# Patient Record
Sex: Female | Born: 1950
Health system: Southern US, Community
[De-identification: ages and names within clinical notes are randomized; demographics above are authoritative.]

## PROBLEM LIST (undated history)

## (undated) DIAGNOSIS — E739 Lactose intolerance, unspecified: Secondary | ICD-10-CM

## (undated) DIAGNOSIS — J387 Other diseases of larynx: Secondary | ICD-10-CM

## (undated) DIAGNOSIS — M7661 Achilles tendinitis, right leg: Secondary | ICD-10-CM

## (undated) DIAGNOSIS — R7303 Prediabetes: Secondary | ICD-10-CM

## (undated) DIAGNOSIS — Z889 Allergy status to unspecified drugs, medicaments and biological substances status: Secondary | ICD-10-CM

## (undated) DIAGNOSIS — M9260 Juvenile osteochondrosis of tarsus, unspecified ankle: Secondary | ICD-10-CM

## (undated) DIAGNOSIS — Z8781 Personal history of (healed) traumatic fracture: Secondary | ICD-10-CM

## (undated) DIAGNOSIS — R7301 Impaired fasting glucose: Secondary | ICD-10-CM

## (undated) DIAGNOSIS — M8580 Other specified disorders of bone density and structure, unspecified site: Secondary | ICD-10-CM

## (undated) DIAGNOSIS — A6009 Herpesviral infection of other urogenital tract: Secondary | ICD-10-CM

## (undated) HISTORY — DX: Other diseases of larynx: J38.7

## (undated) HISTORY — DX: Other specified disorders of bone density and structure, unspecified site: M85.80

## (undated) HISTORY — DX: Lactose intolerance, unspecified: E73.9

## (undated) HISTORY — DX: Juvenile osteochondrosis of tarsus, unspecified ankle: M92.60

## (undated) HISTORY — DX: Achilles tendinitis, right leg: M76.61

## (undated) HISTORY — DX: Herpesviral infection of other urogenital tract: A60.09

## (undated) HISTORY — PX: COLONOSCOPY: SHX174

## (undated) HISTORY — PX: COSMETIC SURGERY: SHX468

## (undated) HISTORY — DX: Personal history of (healed) traumatic fracture: Z87.81

## (undated) HISTORY — DX: Allergy status to unspecified drugs, medicaments and biological substances status: Z88.9

## (undated) HISTORY — PX: BREAST CYST ASPIRATION: SHX578

## (undated) HISTORY — DX: Impaired fasting glucose: R73.01

---
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
## 1998-11-28 ENCOUNTER — Ambulatory Visit (HOSPITAL_COMMUNITY): Admission: RE | Admit: 1998-11-28 | Discharge: 1998-11-28 | Payer: Self-pay | Admitting: Family Medicine

## 2000-10-14 ENCOUNTER — Other Ambulatory Visit: Admission: RE | Admit: 2000-10-14 | Discharge: 2000-10-14 | Payer: Self-pay | Admitting: Obstetrics and Gynecology

## 2000-10-16 ENCOUNTER — Emergency Department (HOSPITAL_COMMUNITY): Admission: EM | Admit: 2000-10-16 | Discharge: 2000-10-17 | Payer: Self-pay | Admitting: Emergency Medicine

## 2000-10-17 ENCOUNTER — Encounter: Payer: Self-pay | Admitting: Emergency Medicine

## 2001-11-04 ENCOUNTER — Other Ambulatory Visit: Admission: RE | Admit: 2001-11-04 | Discharge: 2001-11-04 | Payer: Self-pay | Admitting: Obstetrics and Gynecology

## 2002-01-31 ENCOUNTER — Emergency Department (HOSPITAL_COMMUNITY): Admission: EM | Admit: 2002-01-31 | Discharge: 2002-01-31 | Payer: Self-pay | Admitting: Emergency Medicine

## 2002-01-31 ENCOUNTER — Encounter: Payer: Self-pay | Admitting: Emergency Medicine

## 2002-11-07 ENCOUNTER — Other Ambulatory Visit: Admission: RE | Admit: 2002-11-07 | Discharge: 2002-11-07 | Payer: Self-pay | Admitting: Obstetrics and Gynecology

## 2002-12-19 ENCOUNTER — Ambulatory Visit (HOSPITAL_COMMUNITY): Admission: RE | Admit: 2002-12-19 | Discharge: 2002-12-19 | Payer: Self-pay | Admitting: Gastroenterology

## 2003-09-19 ENCOUNTER — Other Ambulatory Visit: Admission: RE | Admit: 2003-09-19 | Discharge: 2003-09-19 | Payer: Self-pay | Admitting: Obstetrics and Gynecology

## 2004-09-19 ENCOUNTER — Other Ambulatory Visit: Admission: RE | Admit: 2004-09-19 | Discharge: 2004-09-19 | Payer: Self-pay | Admitting: Obstetrics and Gynecology

## 2007-02-18 LAB — HM COLONOSCOPY: HM Colonoscopy: NORMAL

## 2009-02-17 DIAGNOSIS — A6009 Herpesviral infection of other urogenital tract: Secondary | ICD-10-CM | POA: Insufficient documentation

## 2009-02-17 HISTORY — DX: Herpesviral infection of other urogenital tract: A60.09

## 2010-08-02 ENCOUNTER — Ambulatory Visit (INDEPENDENT_AMBULATORY_CARE_PROVIDER_SITE_OTHER): Payer: Commercial Indemnity | Admitting: Family Medicine

## 2010-08-02 ENCOUNTER — Encounter: Payer: Self-pay | Admitting: Family Medicine

## 2010-08-02 DIAGNOSIS — Z9109 Other allergy status, other than to drugs and biological substances: Secondary | ICD-10-CM

## 2010-08-02 DIAGNOSIS — Z0000 Encounter for general adult medical examination without abnormal findings: Secondary | ICD-10-CM

## 2010-08-02 DIAGNOSIS — A6009 Herpesviral infection of other urogenital tract: Secondary | ICD-10-CM

## 2010-08-02 DIAGNOSIS — Z860100 Personal history of colon polyps, unspecified: Secondary | ICD-10-CM

## 2010-08-02 DIAGNOSIS — Z889 Allergy status to unspecified drugs, medicaments and biological substances status: Secondary | ICD-10-CM | POA: Insufficient documentation

## 2010-08-02 DIAGNOSIS — B059 Measles without complication: Secondary | ICD-10-CM

## 2010-08-02 DIAGNOSIS — A6000 Herpesviral infection of urogenital system, unspecified: Secondary | ICD-10-CM

## 2010-08-02 DIAGNOSIS — E739 Lactose intolerance, unspecified: Secondary | ICD-10-CM | POA: Insufficient documentation

## 2010-08-02 DIAGNOSIS — E860 Dehydration: Secondary | ICD-10-CM | POA: Insufficient documentation

## 2010-08-02 DIAGNOSIS — N6019 Diffuse cystic mastopathy of unspecified breast: Secondary | ICD-10-CM | POA: Insufficient documentation

## 2010-08-02 DIAGNOSIS — L989 Disorder of the skin and subcutaneous tissue, unspecified: Secondary | ICD-10-CM

## 2010-08-02 DIAGNOSIS — B019 Varicella without complication: Secondary | ICD-10-CM

## 2010-08-02 DIAGNOSIS — D709 Neutropenia, unspecified: Secondary | ICD-10-CM

## 2010-08-02 HISTORY — DX: Allergy status to unspecified drugs, medicaments and biological substances: Z88.9

## 2010-08-02 NOTE — Patient Instructions (Signed)

## 2010-08-02 NOTE — Assessment & Plan Note (Signed)
Patient admits to historically bad fluid intake and she agrees to try and increase her intake to 64 oz or more daily. She mentions some very infrequent episodes of feeling light headed for less than a second in the past and resolve spontaneously. She does believe they occur most when she is jogging in the early morning before eating or drinking anything. She encouraged to hydrate better and to consider an electrolyte beverage for exercise. Notify us if episodes become more frequent or last longer. She also notes some vasovagal syncope when she gives blood but is generally able to constrol it by not looking. Otherwise no h/o syncope

## 2010-08-02 NOTE — Assessment & Plan Note (Signed)
No recent difficulties, patient has given up all caffeine

## 2010-08-02 NOTE — Assessment & Plan Note (Signed)
She follows with OB/GYN Dr Dareen Piano. She will manage this there, we discussed bone densitometry at length today. She has had a baseline scan around 52 which was normal she will decide with her GYN whether to do another one now or wait til 62, she is encouraged to continue trying to get 3 servings of calcium/vit D daily. Continue exercise.

## 2010-08-02 NOTE — Assessment & Plan Note (Signed)
Patient reports she has not had fasting labs in roughly 3 years, she agrees to come back next week fasting for labs. She had a clean colonoscopy at 63 with Dr Loreta Ave needs another one at 52 if nothing changes. Will continue with MGM and paps with OB/GYN

## 2010-08-02 NOTE — Progress Notes (Signed)
Bonnie [name] [id number] [birth year] 08/02/2010      Progress Note New Patient  Subjective  Chief Complaint  Chief Complaint  Patient presents with  . Establish Care    new patient    HPI  Patient is a 60 year old Caucasian female who is in today for new patient appt. She is generally in good health but has not had a check up with a PCP including lab work in roughly 3 years. She does see a OB/GYN who does annual paps and keeps her MGMs current. She has had trouble with fibrocystic changes in the past but with giving up caffeine that has resolved. No recent febrile illness/chills/congestion/CP/palp/SOB/GI or GU c/o. She is noting some stiffness in her right arm but she attributes that to swimming this morning for the first time in a long time. She was having trouble with some gaseousness but she cut out Lactose a couple of months ago and that has improved. No diarrhea/bloody or tarry stool. Had her first colonosocopy at 62, had a benign polyp, had a repeat at 89 which was clear and she is now on a 10 year cycle with Dr Loreta Ave  Past Medical History  Diagnosis Date  . Chicken pox child  . Measles as a child  . Genital herpes in women 02-17-09  . Lactose intolerance     ?  . Multiple allergies 08/02/2010  . Neutropenia 08/02/2010    Past Surgical History  Procedure Date  . Cataract surgery 4-12 and 5-12  . Broken leg 6 months old  . Teeth pulled 60 yrs old  . Cosmetic surgery 60 yrs old    nose    Family History  Problem Relation Age of Onset  . Heart disease Father     passed after by pass surgery  . Hypertension Father   . Hyperlipidemia Father   . Other Sister     auto immune issues  . Gout Sister   . Autoimmune disease Sister     fatigue, rashes  . Osteopenia Sister   . Osteoporosis Mother     History   Social History  . Marital Status: Married    Spouse Name: N/A    Number of Children: N/A  . Years of Education: N/A   Occupational History  . Not on file.    Social History Main Topics  . Smoking status: Never Smoker   . Smokeless tobacco: Never Used  . Alcohol Use: No     1-2 beers a week or 1-2 glasses of wine a week  . Drug Use: No  . Sexually Active: Yes -- Female partner(s)   Other Topics Concern  . Not on file   Social History Narrative  . No narrative on file     No Known Allergies  Review of Systems  Review of Systems  Constitutional: Negative for fever, chills and malaise/fatigue.  HENT: Negative for hearing loss, nosebleeds and congestion.   Eyes: Negative for discharge.  Respiratory: Negative for cough, sputum production, shortness of breath and wheezing.   Cardiovascular: Negative for chest pain, palpitations and leg swelling.  Gastrointestinal: Negative for heartburn, nausea, vomiting, abdominal pain, diarrhea, constipation and blood in stool.  Genitourinary: Negative for dysuria, urgency, frequency and hematuria.  Musculoskeletal: Negative for myalgias, back pain and falls.  Skin: Negative for rash.  Neurological: Negative for dizziness, tremors, sensory change, focal weakness, loss of consciousness, weakness and headaches.  Endo/Heme/Allergies: Negative for polydipsia. Does not bruise/bleed easily.  Psychiatric/Behavioral: Negative for depression and  suicidal ideas. The patient is not nervous/anxious and does not have insomnia.     Objective  BP 114/75  Pulse 67  Temp(Src) 97.6 F (36.4 C) (Oral)  Ht 5' 4.25" (1.632 m)  Wt 121 lb 12.8 oz (55.248 kg)  BMI 20.74 kg/m2  SpO2 100%  Physical Exam  Physical Exam  Constitutional: She is oriented to person, place, and time and well-developed, well-nourished, and in no distress. No distress.  HENT:  Head: Normocephalic and atraumatic.  Right Ear: External ear normal.  Left Ear: External ear normal.  Nose: Nose normal.  Mouth/Throat: Oropharynx is clear and moist. No oropharyngeal exudate.  Eyes: Conjunctivae are normal. Pupils are equal, round, and reactive  to light. Right eye exhibits no discharge. Left eye exhibits no discharge. No scleral icterus.  Neck: Normal range of motion. Neck supple. No thyromegaly present.  Cardiovascular: Normal rate, regular rhythm, normal heart sounds and intact distal pulses.   No murmur heard. Pulmonary/Chest: Effort normal and breath sounds normal. No respiratory distress. She has no wheezes. She has no rales.  Abdominal: Soft. Bowel sounds are normal. She exhibits no distension and no mass. There is no tenderness.  Musculoskeletal: Normal range of motion. She exhibits no edema and no tenderness.  Lymphadenopathy:    She has no cervical adenopathy.  Neurological: She is alert and oriented to person, place, and time. She has normal reflexes. No cranial nerve deficit. Coordination normal.  Skin: Skin is warm and dry. No rash noted. She is not diaphoretic.  Psychiatric: Mood, memory and affect normal.       Assessment & Plan  Neutropenia Very slightly low when last checked will repeat CBC with blood draw  Dehydration Patient admits to historically bad fluid intake and she agrees to try and increase her intake to 64 oz or more daily. She mentions some very infrequent episodes of feeling light headed for less than a second in the past and resolve spontaneously. She does believe they occur most when she is jogging in the early morning before eating or drinking anything. She encouraged to hydrate better and to consider an electrolyte beverage for exercise. Notify us if episodes become more frequent or last longer. She also notes some vasovagal syncope when she gives blood but is generally able to constrol it by not looking. Otherwise no h/o syncope  Genital herpes in women She follows with OB/GYN Dr Dareen Piano. She will manage this there, we discussed bone densitometry at length today. She has had a baseline scan around 52 which was normal she will decide with her GYN whether to do another one now or wait til 62, she  is encouraged to continue trying to get 3 servings of calcium/vit D daily. Continue exercise.  Lactose intolerance Patient reports switching to lactose free products several months ago and seeing a great improvement in her gaseousness. She will continue for now but be aware of need for calcium intake  Multiple allergies No adult symptoms at present  Preventative health care Patient reports she has not had fasting labs in roughly 3 years, she agrees to come back next week fasting for labs. She had a clean colonoscopy at 76 with Dr Loreta Ave needs another one at 75 if nothing changes. Will continue with MGM and paps with OB/GYN  Fibrocystic breast disease No recent difficulties, patient has given up all caffeine  History of colon polyps Patient had a benign polyp with her first colonoscopy at 60yo, clean at 57 is now on 10 year intervals  with Dr Loreta Ave

## 2010-08-02 NOTE — Assessment & Plan Note (Signed)
Patient had a benign polyp with her first colonoscopy at 60yo, clean at 53 is now on 10 year intervals with Dr Loreta Ave

## 2010-08-02 NOTE — Assessment & Plan Note (Signed)
No adult symptoms at present

## 2010-08-02 NOTE — Assessment & Plan Note (Signed)
Patient reports switching to lactose free products several months ago and seeing a great improvement in her gaseousness. She will continue for now but be aware of need for calcium intake

## 2010-08-02 NOTE — Assessment & Plan Note (Signed)
Very slightly low when last checked will repeat CBC with blood draw

## 2010-08-19 ENCOUNTER — Other Ambulatory Visit (INDEPENDENT_AMBULATORY_CARE_PROVIDER_SITE_OTHER): Payer: Commercial Indemnity

## 2010-08-19 DIAGNOSIS — Z0000 Encounter for general adult medical examination without abnormal findings: Secondary | ICD-10-CM

## 2010-08-19 LAB — CBC WITH DIFFERENTIAL/PLATELET
Basophils Absolute: 0 10*3/uL (ref 0.0–0.1)
Basophils Relative: 1.1 % (ref 0.0–3.0)
Eosinophils Absolute: 0.1 10*3/uL (ref 0.0–0.7)
Eosinophils Relative: 2.4 % (ref 0.0–5.0)
HCT: 35.7 % — ABNORMAL LOW (ref 36.0–46.0)
Hemoglobin: 12.2 g/dL (ref 12.0–15.0)
Lymphocytes Relative: 34.3 % (ref 12.0–46.0)
Lymphs Abs: 1.4 10*3/uL (ref 0.7–4.0)
MCHC: 34.3 g/dL (ref 30.0–36.0)
MCV: 86.8 fl (ref 78.0–100.0)
Monocytes Absolute: 0.3 10*3/uL (ref 0.1–1.0)
Monocytes Relative: 7.4 % (ref 3.0–12.0)
Neutro Abs: 2.2 10*3/uL (ref 1.4–7.7)
Neutrophils Relative %: 54.8 % (ref 43.0–77.0)
Platelets: 167 10*3/uL (ref 150.0–400.0)
RBC: 4.11 Mil/uL (ref 3.87–5.11)
RDW: 12.9 % (ref 11.5–14.6)
WBC: 4 10*3/uL — ABNORMAL LOW (ref 4.5–10.5)

## 2010-08-19 LAB — RENAL FUNCTION PANEL
Albumin: 4.4 g/dL (ref 3.5–5.2)
BUN: 17 mg/dL (ref 6–23)
CO2: 31 mEq/L (ref 19–32)
Calcium: 9.3 mg/dL (ref 8.4–10.5)
Chloride: 105 mEq/L (ref 96–112)
Creatinine, Ser: 0.8 mg/dL (ref 0.4–1.2)
GFR: 83.09 mL/min (ref >60.00)
Glucose, Bld: 163 mg/dL — ABNORMAL HIGH (ref 70–99)
Phosphorus: 3.6 mg/dL (ref 2.3–4.6)
Potassium: 5 mEq/L (ref 3.5–5.1)
Sodium: 139 mEq/L (ref 135–145)

## 2010-08-19 LAB — LIPID PANEL
Cholesterol: 163 mg/dL (ref 0–200)
HDL: 56.7 mg/dL (ref >39.00)
LDL Cholesterol: 97 mg/dL (ref 0–99)
Total CHOL/HDL Ratio: 3
Triglycerides: 47 mg/dL (ref 0.0–149.0)
VLDL: 9.4 mg/dL (ref 0.0–40.0)

## 2010-08-19 LAB — HEPATIC FUNCTION PANEL
ALT: 18 U/L (ref 0–35)
AST: 23 U/L (ref 0–37)
Albumin: 4.4 g/dL (ref 3.5–5.2)
Alkaline Phosphatase: 72 U/L (ref 39–117)
Bilirubin, Direct: 0 mg/dL (ref 0.0–0.3)
Total Bilirubin: 0.7 mg/dL (ref 0.3–1.2)
Total Protein: 6.6 g/dL (ref 6.0–8.3)

## 2010-08-19 LAB — TSH: TSH: 1.49 u[IU]/mL (ref 0.35–5.50)

## 2010-10-18 ENCOUNTER — Telehealth: Payer: Self-pay | Admitting: Family Medicine

## 2010-10-18 NOTE — Telephone Encounter (Signed)
Patient would like if she need to have these test done a renal function panel test ,tsh,hepatic function panel ,cbc with diff ,and lipid profile.

## 2010-10-21 NOTE — Telephone Encounter (Signed)
No did a lipid, tsh, hepatic in July which were all normal. Her CBC was off slightly and her sugar was up so would do a cbc, renal and hgba1c, fasting if possible

## 2010-10-22 NOTE — Telephone Encounter (Signed)
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

## 2010-12-11 ENCOUNTER — Encounter: Payer: Self-pay | Admitting: Family Medicine

## 2010-12-11 ENCOUNTER — Ambulatory Visit (INDEPENDENT_AMBULATORY_CARE_PROVIDER_SITE_OTHER): Payer: Commercial Indemnity | Admitting: Family Medicine

## 2010-12-11 DIAGNOSIS — M542 Cervicalgia: Secondary | ICD-10-CM

## 2010-12-11 NOTE — Assessment & Plan Note (Addendum)
Symptoms present for 8 days now. Describes tightness and pulling worse after working out. She describes the skin and muscle is tender to the touch. Symptoms radiate from the base of the skull to the shoulder along the sternocleidomastoid muscle. Encouraged moist heat and gentle stretching. Aspirin Aspercreme when necessary and return for further evaluation if symptoms persist or worsen.

## 2010-12-11 NOTE — Patient Instructions (Signed)
Back Pain, Adult Low back pain is very common. About 1 in 5 people have back pain.The cause of low back pain is rarely dangerous. The pain often gets better over time.About half of people with a sudden onset of back pain feel better in just 2 weeks. About 8 in 10 people feel better by 6 weeks.  CAUSES Some common causes of back pain include:  Strain of the muscles or ligaments supporting the spine.   Wear and tear (degeneration) of the spinal discs.   Arthritis.   Direct injury to the back.  DIAGNOSIS Most of the time, the direct cause of low back pain is not known.However, back pain can be treated effectively even when the exact cause of the pain is unknown.Answering your caregiver's questions about your overall health and symptoms is one of the most accurate ways to make sure the cause of your pain is not dangerous. If your caregiver needs more information, he or she may order lab work or imaging tests (X-rays or MRIs).However, even if imaging tests show changes in your back, this usually does not require surgery. HOME CARE INSTRUCTIONS For many people, back pain returns.Since low back pain is rarely dangerous, it is often a condition that people can learn to manageon their own.   Remain active. It is stressful on the back to sit or stand in one place. Do not sit, drive, or stand in one place for more than 30 minutes at a time. Take short walks on level surfaces as soon as pain allows.Try to increase the length of time you walk each day.   Do not stay in bed.Resting more than 1 or 2 days can delay your recovery.   Do not avoid exercise or work.Your body is made to move.It is not dangerous to be active, even though your back may hurt.Your back will likely heal faster if you return to being active before your pain is gone.   Pay attention to your body when you bend and lift. Many people have less discomfortwhen lifting if they bend their knees, keep the load close to their  bodies,and avoid twisting. Often, the most comfortable positions are those that put less stress on your recovering back.   Find a comfortable position to sleep. Use a firm mattress and lie on your side with your knees slightly bent. If you lie on your back, put a pillow under your knees.   Only take over-the-counter or prescription medicines as directed by your caregiver. Over-the-counter medicines to reduce pain and inflammation are often the most helpful.Your caregiver may prescribe muscle relaxant drugs.These medicines help dull your pain so you can more quickly return to your normal activities and healthy exercise.   Put ice on the injured area.   Put ice in a plastic bag.   Place a towel between your skin and the bag.   Leave the ice on for 15 to 20 minutes, 3 to 4 times a day for the first 2 to 3 days. After that, ice and heat may be alternated to reduce pain and spasms.   Ask your caregiver about trying back exercises and gentle massage. This may be of some benefit.   Avoid feeling anxious or stressed.Stress increases muscle tension and can worsen back pain.It is important to recognize when you are anxious or stressed and learn ways to manage it.Exercise is a great option.  SEEK MEDICAL CARE IF:  You have pain that is not relieved with rest or medicine.   You have   pain that does not improve in 1 week.   You have new symptoms.   You are generally not feeling well.  SEEK IMMEDIATE MEDICAL CARE IF:   You have pain that radiates from your back into your legs.   You develop new bowel or bladder control problems.   You have unusual weakness or numbness in your arms or legs.   You develop nausea or vomiting.   You develop abdominal pain.   You feel faint.  Document Released: 02/03/2005 Document Revised: 10/16/2010 Document Reviewed: 06/24/2010 [employer] Patient Information 2012 [place], [place].   Consider Aspirin once to twice daily as needed, can apply Aspercreme as  needed, and moist heat and gentle stretching twice daily, call if no improvement or symptoms worsen

## 2010-12-15 NOTE — Progress Notes (Signed)
Bonnie [name] [id number] [birth year] 12/15/2010      Progress Note-Follow Up  Subjective  Chief Complaint  Chief Complaint  Patient presents with  . Otalgia    left ear X 8 days    HPI   patient is a 60 year old Caucasian female generally in good health who has been having trouble w neck and ear on the left side for about 8 days. She notes the symptoms do develop after a workout. She describes the skin and muscle around the base of the skull surrounding her left ear is tender. The pain radiates down the left side of the neck to the left shoulder. The symptoms and left arm. No trauma or falls. No congestion, hearing changes. No cough, no fever, chest pain, gout, shortness of breath, GI or GU complaints noted today.  Past Medical History  Diagnosis Date  . Chicken pox child  . Measles as a child  . Genital herpes in women 02-17-09  . Lactose intolerance     ?  . Multiple allergies 08/02/2010  . Neutropenia 08/02/2010  . Neck pain, acute 12/11/2010    Past Surgical History  Procedure Date  . Cataract surgery 4-12 and 5-12  . Broken leg 8 months old  . Teeth pulled 60 yrs old  . Cosmetic surgery 60 yrs old    nose    Family History  Problem Relation Age of Onset  . Heart disease Father     passed after by pass surgery  . Hypertension Father   . Hyperlipidemia Father   . Other Sister     auto immune issues  . Gout Sister   . Autoimmune disease Sister     fatigue, rashes  . Osteopenia Sister   . Osteoporosis Mother     History   Social History  . Marital Status: Married    Spouse Name: N/A    Number of Children: N/A  . Years of Education: N/A   Occupational History  . Not on file.   Social History Main Topics  . Smoking status: Never Smoker   . Smokeless tobacco: Never Used  . Alcohol Use: No     1-2 beers a week or 1-2 glasses of wine a week  . Drug Use: No  . Sexually Active: Yes -- Female partner(s)   Other Topics Concern  . Not on file   Social History  Narrative  . No narrative on file    Current Outpatient Prescriptions on File Prior to Visit  Medication Sig Dispense Refill  . Calcium Carbonate-Vitamin D (CALCIUM-VITAMIN D) 500-200 MG-UNIT per tablet Take 1 tablet by mouth daily.        . multivitamin (THERAGRAN) tablet Take 1 tablet by mouth daily.          No Known Allergies  Review of Systems  Review of Systems  Constitutional: Negative for fever and malaise/fatigue.  HENT: Positive for neck pain. Negative for congestion.   Eyes: Negative for discharge.  Respiratory: Negative for shortness of breath.   Cardiovascular: Negative for chest pain, palpitations and leg swelling.  Gastrointestinal: Negative for nausea, abdominal pain and diarrhea.  Genitourinary: Negative for dysuria.  Musculoskeletal: Negative for falls.  Skin: Negative for rash.  Neurological: Negative for loss of consciousness and headaches.  Endo/Heme/Allergies: Negative for polydipsia.  Psychiatric/Behavioral: Negative for depression and suicidal ideas. The patient is not nervous/anxious and does not have insomnia.     Objective  BP 111/73  Pulse 64  Temp(Src) 97.8 F (36.6 C) (Oral)  Ht 5' 4.25" (1.632 m)  Wt 119 lb 6.4 oz (54.159 kg)  BMI 20.34 kg/m2  SpO2 99%  Physical Exam  Physical Exam  Constitutional: She is oriented to person, place, and time and well-developed, well-nourished, and in no distress. No distress.  HENT:  Head: Normocephalic and atraumatic.  Eyes: Conjunctivae are normal.  Neck: Normal range of motion. Neck supple. No thyromegaly present.  Cardiovascular: Normal rate, regular rhythm and normal heart sounds.   No murmur heard. Pulmonary/Chest: Effort normal and breath sounds normal. She has no wheezes.  Abdominal: She exhibits no distension and no mass.  Musculoskeletal: Normal range of motion. She exhibits tenderness. She exhibits no edema.       Along left sternocleidomastoid muscle  Lymphadenopathy:    She has no  cervical adenopathy.  Neurological: She is alert and oriented to person, place, and time.  Skin: Skin is warm and dry. No rash noted. She is not diaphoretic.  Psychiatric: Memory, affect and judgment normal.    Lab Results  Component Value Date   TSH 1.49 08/19/2010   Lab Results  Component Value Date   WBC 4.0* 08/19/2010   HGB 12.2 08/19/2010   HCT 35.7* 08/19/2010   MCV 86.8 08/19/2010   PLT 167.0 08/19/2010   Lab Results  Component Value Date   CREATININE 0.8 08/19/2010   BUN 17 08/19/2010   NA 139 08/19/2010   K 5.0 08/19/2010   CL 105 08/19/2010   CO2 31 08/19/2010   Lab Results  Component Value Date   ALT 18 08/19/2010   AST 23 08/19/2010   ALKPHOS 72 08/19/2010   BILITOT 0.7 08/19/2010   Lab Results  Component Value Date   CHOL 163 08/19/2010   Lab Results  Component Value Date   HDL 56.70 08/19/2010   Lab Results  Component Value Date   LDLCALC 97 08/19/2010   Lab Results  Component Value Date   TRIG 47.0 08/19/2010   Lab Results  Component Value Date   CHOLHDL 3 08/19/2010     Assessment & Plan  Neck pain, acute Symptoms present for 8 days now. Describes tightness and pulling worse after working out. She describes the skin and muscle is tender to the touch. Symptoms radiate from the base of the skull to the shoulder along the sternocleidomastoid muscle. Encouraged moist heat and gentle stretching. Aspirin Aspercreme when necessary and return for further evaluation if symptoms persist or worsen.

## 2011-08-26 ENCOUNTER — Encounter: Payer: Self-pay | Admitting: Family Medicine

## 2011-08-26 ENCOUNTER — Ambulatory Visit (INDEPENDENT_AMBULATORY_CARE_PROVIDER_SITE_OTHER): Payer: Commercial Indemnity | Admitting: Family Medicine

## 2011-08-26 VITALS — BP 112/80 | HR 68 | Temp 98.0°F | Resp 18 | Wt 122.1 lb

## 2011-08-26 DIAGNOSIS — A6009 Herpesviral infection of other urogenital tract: Secondary | ICD-10-CM

## 2011-08-26 DIAGNOSIS — M25569 Pain in unspecified knee: Secondary | ICD-10-CM

## 2011-08-26 DIAGNOSIS — R7309 Other abnormal glucose: Secondary | ICD-10-CM

## 2011-08-26 DIAGNOSIS — R358 Other polyuria: Secondary | ICD-10-CM

## 2011-08-26 DIAGNOSIS — R739 Hyperglycemia, unspecified: Secondary | ICD-10-CM

## 2011-08-26 DIAGNOSIS — M25561 Pain in right knee: Secondary | ICD-10-CM | POA: Insufficient documentation

## 2011-08-26 DIAGNOSIS — H6092 Unspecified otitis externa, left ear: Secondary | ICD-10-CM | POA: Insufficient documentation

## 2011-08-26 DIAGNOSIS — A6000 Herpesviral infection of urogenital system, unspecified: Secondary | ICD-10-CM

## 2011-08-26 DIAGNOSIS — R3589 Other polyuria: Secondary | ICD-10-CM | POA: Insufficient documentation

## 2011-08-26 DIAGNOSIS — T7840XA Allergy, unspecified, initial encounter: Secondary | ICD-10-CM

## 2011-08-26 DIAGNOSIS — Z889 Allergy status to unspecified drugs, medicaments and biological substances status: Secondary | ICD-10-CM

## 2011-08-26 DIAGNOSIS — Z0000 Encounter for general adult medical examination without abnormal findings: Secondary | ICD-10-CM

## 2011-08-26 DIAGNOSIS — H60399 Other infective otitis externa, unspecified ear: Secondary | ICD-10-CM

## 2011-08-26 DIAGNOSIS — M542 Cervicalgia: Secondary | ICD-10-CM

## 2011-08-26 DIAGNOSIS — Z86010 Personal history of colonic polyps: Secondary | ICD-10-CM

## 2011-08-26 LAB — HEPATIC FUNCTION PANEL
ALT: 19 U/L (ref 0–35)
AST: 21 U/L (ref 0–37)
Albumin: 4.4 g/dL (ref 3.5–5.2)
Alkaline Phosphatase: 63 U/L (ref 39–117)
Bilirubin, Direct: 0 mg/dL (ref 0.0–0.3)
Total Bilirubin: 0.7 mg/dL (ref 0.3–1.2)
Total Protein: 7.3 g/dL (ref 6.0–8.3)

## 2011-08-26 LAB — RENAL FUNCTION PANEL
Albumin: 4.4 g/dL (ref 3.5–5.2)
BUN: 20 mg/dL (ref 6–23)
CO2: 29 mEq/L (ref 19–32)
Calcium: 9.7 mg/dL (ref 8.4–10.5)
Chloride: 101 mEq/L (ref 96–112)
Creatinine, Ser: 0.8 mg/dL (ref 0.4–1.2)
GFR: 79.18 mL/min (ref >60.00)
Glucose, Bld: 83 mg/dL (ref 70–99)
Phosphorus: 3.5 mg/dL (ref 2.3–4.6)
Potassium: 4.5 mEq/L (ref 3.5–5.1)
Sodium: 138 mEq/L (ref 135–145)

## 2011-08-26 LAB — CBC WITH DIFFERENTIAL/PLATELET
Basophils Absolute: 0 10*3/uL (ref 0.0–0.1)
Basophils Relative: 1 % (ref 0.0–3.0)
Eosinophils Absolute: 0.1 10*3/uL (ref 0.0–0.7)
Eosinophils Relative: 2 % (ref 0.0–5.0)
HCT: 37.1 % (ref 36.0–46.0)
Hemoglobin: 12.5 g/dL (ref 12.0–15.0)
Lymphocytes Relative: 34.6 % (ref 12.0–46.0)
Lymphs Abs: 1.4 10*3/uL (ref 0.7–4.0)
MCHC: 33.6 g/dL (ref 30.0–36.0)
MCV: 86.4 fl (ref 78.0–100.0)
Monocytes Absolute: 0.4 10*3/uL (ref 0.1–1.0)
Monocytes Relative: 9.4 % (ref 3.0–12.0)
Neutro Abs: 2.1 10*3/uL (ref 1.4–7.7)
Neutrophils Relative %: 53 % (ref 43.0–77.0)
Platelets: 172 10*3/uL (ref 150.0–400.0)
RBC: 4.29 Mil/uL (ref 3.87–5.11)
RDW: 12.8 % (ref 11.5–14.6)
WBC: 3.9 10*3/uL — ABNORMAL LOW (ref 4.5–10.5)

## 2011-08-26 LAB — HEMOGLOBIN A1C: Hgb A1c MFr Bld: 6 % (ref 4.6–6.5)

## 2011-08-26 LAB — POCT URINALYSIS DIPSTICK
Bilirubin, UA: NEGATIVE
Blood, UA: NEGATIVE
Glucose, UA: NEGATIVE
Ketones, UA: NEGATIVE
Leukocytes, UA: NEGATIVE
Nitrite, UA: NEGATIVE
Protein, UA: NEGATIVE
Spec Grav, UA: 1.02
Urobilinogen, UA: 0.2
pH, UA: 7

## 2011-08-26 LAB — LIPID PANEL
Cholesterol: 189 mg/dL (ref 0–200)
HDL: 65 mg/dL (ref >39.00)
LDL Cholesterol: 109 mg/dL — ABNORMAL HIGH (ref 0–99)
Total CHOL/HDL Ratio: 3
Triglycerides: 74 mg/dL (ref 0.0–149.0)
VLDL: 14.8 mg/dL (ref 0.0–40.0)

## 2011-08-26 MED ORDER — HYDROCORTISONE-ACETIC ACID 1-2 % OT SOLN: 4.0000 [drp] | Freq: Two times a day (BID) | OTIC | Status: AC

## 2011-08-26 NOTE — Assessment & Plan Note (Signed)
No significant symptoms noted today

## 2011-08-26 NOTE — Assessment & Plan Note (Signed)
No c/o today 

## 2011-08-26 NOTE — Assessment & Plan Note (Signed)
Over lateral meniscus with excessive use and twisting, encouraged soft brace especially when hiking, ice and Aspercreme, start a MegaRed krill oil cap daily and report if no improvement

## 2011-08-26 NOTE — Assessment & Plan Note (Signed)
Needs next colonoscopy in 2019, has a gynecologist she sees regularly. Check fasting labs and encouraged a heart healthy diet.

## 2011-08-26 NOTE — Patient Instructions (Signed)
Preventive Care for Adults, Female A healthy lifestyle and preventive care can promote health and wellness. Preventive health guidelines for women include the following key practices.  A routine yearly physical is a good way to check with your caregiver about your health and preventive screening. It is a chance to share any concerns and updates on your health, and to receive a thorough exam.   Visit your dentist for a routine exam and preventive care every 6 months. Brush your teeth twice a day and floss once a day. Good oral hygiene prevents tooth decay and gum disease.   The frequency of eye exams is based on your age, health, family medical history, use of contact lenses, and other factors. Follow your caregiver's recommendations for frequency of eye exams.   Eat a healthy diet. Foods like vegetables, fruits, whole grains, low-fat dairy products, and lean protein foods contain the nutrients you need without too many calories. Decrease your intake of foods high in solid fats, added sugars, and salt. Eat the right amount of calories for you.Get information about a proper diet from your caregiver, if necessary.   Regular physical exercise is one of the most important things you can do for your health. Most adults should get at least 150 minutes of moderate-intensity exercise (any activity that increases your heart rate and causes you to sweat) each week. In addition, most adults need muscle-strengthening exercises on 2 or more days a week.   Maintain a healthy weight. The body mass index (BMI) is a screening tool to identify possible weight problems. It provides an estimate of body fat based on height and weight. Your caregiver can help determine your BMI, and can help you achieve or maintain a healthy weight.For adults 20 years and older:   A BMI below 18.5 is considered underweight.   A BMI of 18.5 to 24.9 is normal.   A BMI of 25 to 29.9 is considered overweight.   A BMI of 30 and above is  considered obese.   Maintain normal blood lipids and cholesterol levels by exercising and minimizing your intake of saturated fat. Eat a balanced diet with plenty of fruit and vegetables. Blood tests for lipids and cholesterol should begin at age 20 and be repeated every 5 years. If your lipid or cholesterol levels are high, you are over 50, or you are at high risk for heart disease, you may need your cholesterol levels checked more frequently.Ongoing high lipid and cholesterol levels should be treated with medicines if diet and exercise are not effective.   If you smoke, find out from your caregiver how to quit. If you do not use tobacco, do not start.   If you are pregnant, do not drink alcohol. If you are breastfeeding, be very cautious about drinking alcohol. If you are not pregnant and choose to drink alcohol, do not exceed 1 drink per day. One drink is considered to be 12 ounces (355 mL) of beer, 5 ounces (148 mL) of wine, or 1.5 ounces (44 mL) of liquor.   Avoid use of street drugs. Do not share needles with anyone. Ask for help if you need support or instructions about stopping the use of drugs.   High blood pressure causes heart disease and increases the risk of stroke. Your blood pressure should be checked at least every 1 to 2 years. Ongoing high blood pressure should be treated with medicines if weight loss and exercise are not effective.   If you are 55 to 59   years old, ask your caregiver if you should take aspirin to prevent strokes.   Diabetes screening involves taking a blood sample to check your fasting blood sugar level. This should be done once every 3 years, after age 45, if you are within normal weight and without risk factors for diabetes. Testing should be considered at a younger age or be carried out more frequently if you are overweight and have at least 1 risk factor for diabetes.   Breast cancer screening is essential preventive care for women. You should practice "breast  self-awareness." This means understanding the normal appearance and feel of your breasts and may include breast self-examination. Any changes detected, no matter how small, should be reported to a caregiver. Women in their 20s and 30s should have a clinical breast exam (CBE) by a caregiver as part of a regular health exam every 1 to 3 years. After age 40, women should have a CBE every year. Starting at age 40, women should consider having a mammography (breast X-ray test) every year. Women who have a family history of breast cancer should talk to their caregiver about genetic screening. Women at a high risk of breast cancer should talk to their caregivers about having magnetic resonance imaging (MRI) and a mammography every year.   The Pap test is a screening test for cervical cancer. A Pap test can show cell changes on the cervix that might become cervical cancer if left untreated. A Pap test is a procedure in which cells are obtained and examined from the lower end of the uterus (cervix).   Women should have a Pap test starting at age 21.   Between ages 21 and 29, Pap tests should be repeated every 2 years.   Beginning at age 30, you should have a Pap test every 3 years as long as the past 3 Pap tests have been normal.   Some women have medical problems that increase the chance of getting cervical cancer. Talk to your caregiver about these problems. It is especially important to talk to your caregiver if a new problem develops soon after your last Pap test. In these cases, your caregiver may recommend more frequent screening and Pap tests.   The above recommendations are the same for women who have or have not gotten the vaccine for human papillomavirus (HPV).   If you had a hysterectomy for a problem that was not cancer or a condition that could lead to cancer, then you no longer need Pap tests. Even if you no longer need a Pap test, a regular exam is a good idea to make sure no other problems are  starting.   If you are between ages 65 and 70, and you have had normal Pap tests going back 10 years, you no longer need Pap tests. Even if you no longer need a Pap test, a regular exam is a good idea to make sure no other problems are starting.   If you have had past treatment for cervical cancer or a condition that could lead to cancer, you need Pap tests and screening for cancer for at least 20 years after your treatment.   If Pap tests have been discontinued, risk factors (such as a new sexual partner) need to be reassessed to determine if screening should be resumed.   The HPV test is an additional test that may be used for cervical cancer screening. The HPV test looks for the virus that can cause the cell changes on the cervix.   The cells collected during the Pap test can be tested for HPV. The HPV test could be used to screen women aged 30 years and older, and should be used in women of any age who have unclear Pap test results. After the age of 30, women should have HPV testing at the same frequency as a Pap test.   Colorectal cancer can be detected and often prevented. Most routine colorectal cancer screening begins at the age of 50 and continues through age 75. However, your caregiver may recommend screening at an earlier age if you have risk factors for colon cancer. On a yearly basis, your caregiver may provide home test kits to check for hidden blood in the stool. Use of a small camera at the end of a tube, to directly examine the colon (sigmoidoscopy or colonoscopy), can detect the earliest forms of colorectal cancer. Talk to your caregiver about this at age 50, when routine screening begins. Direct examination of the colon should be repeated every 5 to 10 years through age 75, unless early forms of pre-cancerous polyps or small growths are found.   Hepatitis C blood testing is recommended for all people born from 1945 through 1965 and any individual with known risks for hepatitis C.    Practice safe sex. Use condoms and avoid high-risk sexual practices to reduce the spread of sexually transmitted infections (STIs). STIs include gonorrhea, chlamydia, syphilis, trichomonas, herpes, HPV, and human immunodeficiency virus (HIV). Herpes, HIV, and HPV are viral illnesses that have no cure. They can result in disability, cancer, and death. Sexually active women aged 25 and younger should be checked for chlamydia. Older women with new or multiple partners should also be tested for chlamydia. Testing for other STIs is recommended if you are sexually active and at increased risk.   Osteoporosis is a disease in which the bones lose minerals and strength with aging. This can result in serious bone fractures. The risk of osteoporosis can be identified using a bone density scan. Women ages 65 and over and women at risk for fractures or osteoporosis should discuss screening with their caregivers. Ask your caregiver whether you should take a calcium supplement or vitamin D to reduce the rate of osteoporosis.   Menopause can be associated with physical symptoms and risks. Hormone replacement therapy is available to decrease symptoms and risks. You should talk to your caregiver about whether hormone replacement therapy is right for you.   Use sunscreen with sun protection factor (SPF) of 30 or more. Apply sunscreen liberally and repeatedly throughout the day. You should seek shade when your shadow is shorter than you. Protect yourself by wearing long sleeves, pants, a wide-brimmed hat, and sunglasses year round, whenever you are outdoors.   Once a month, do a whole body skin exam, using a mirror to look at the skin on your back. Notify your caregiver of new moles, moles that have irregular borders, moles that are larger than a pencil eraser, or moles that have changed in shape or color.   Stay current with required immunizations.   Influenza. You need a dose every fall (or winter). The composition of  the flu vaccine changes each year, so being vaccinated once is not enough.   Pneumococcal polysaccharide. You need 1 to 2 doses if you smoke cigarettes or if you have certain chronic medical conditions. You need 1 dose at age 65 (or older) if you have never been vaccinated.   Tetanus, diphtheria, pertussis (Tdap, Td). Get 1 dose of   Tdap vaccine if you are younger than age 65, are over 65 and have contact with an infant, are a healthcare worker, are pregnant, or simply want to be protected from whooping cough. After that, you need a Td booster dose every 10 years. Consult your caregiver if you have not had at least 3 tetanus and diphtheria-containing shots sometime in your life or have a deep or dirty wound.   HPV. You need this vaccine if you are a woman age 26 or younger. The vaccine is given in 3 doses over 6 months.   Measles, mumps, rubella (MMR). You need at least 1 dose of MMR if you were born in 1957 or later. You may also need a second dose.   Meningococcal. If you are age 19 to 21 and a first-year college student living in a residence hall, or have one of several medical conditions, you need to get vaccinated against meningococcal disease. You may also need additional booster doses.   Zoster (shingles). If you are age 60 or older, you should get this vaccine.   Varicella (chickenpox). If you have never had chickenpox or you were vaccinated but received only 1 dose, talk to your caregiver to find out if you need this vaccine.   Hepatitis A. You need this vaccine if you have a specific risk factor for hepatitis A virus infection or you simply wish to be protected from this disease. The vaccine is usually given as 2 doses, 6 to 18 months apart.   Hepatitis B. You need this vaccine if you have a specific risk factor for hepatitis B virus infection or you simply wish to be protected from this disease. The vaccine is given in 3 doses, usually over 6 months.  Preventive Services /  Frequency Ages 19 to 39  Blood pressure check.** / Every 1 to 2 years.   Lipid and cholesterol check.** / Every 5 years beginning at age 20.   Clinical breast exam.** / Every 3 years for women in their 20s and 30s.   Pap test.** / Every 2 years from ages 21 through 29. Every 3 years starting at age 30 through age 65 or 70 with a history of 3 consecutive normal Pap tests.   HPV screening.** / Every 3 years from ages 30 through ages 65 to 70 with a history of 3 consecutive normal Pap tests.   Hepatitis C blood test.** / For any individual with known risks for hepatitis C.   Skin self-exam. / Monthly.   Influenza immunization.** / Every year.   Pneumococcal polysaccharide immunization.** / 1 to 2 doses if you smoke cigarettes or if you have certain chronic medical conditions.   Tetanus, diphtheria, pertussis (Tdap, Td) immunization. / A one-time dose of Tdap vaccine. After that, you need a Td booster dose every 10 years.   HPV immunization. / 3 doses over 6 months, if you are 26 and younger.   Measles, mumps, rubella (MMR) immunization. / You need at least 1 dose of MMR if you were born in 1957 or later. You may also need a second dose.   Meningococcal immunization. / 1 dose if you are age 19 to 21 and a first-year college student living in a residence hall, or have one of several medical conditions, you need to get vaccinated against meningococcal disease. You may also need additional booster doses.   Varicella immunization.** / Consult your caregiver.   Hepatitis A immunization.** / Consult your caregiver. 2 doses, 6 to 18 months   apart.   Hepatitis B immunization.** / Consult your caregiver. 3 doses usually over 6 months.  Ages 40 to 64  Blood pressure check.** / Every 1 to 2 years.   Lipid and cholesterol check.** / Every 5 years beginning at age 20.   Clinical breast exam.** / Every year after age 40.   Mammogram.** / Every year beginning at age 40 and continuing for as  long as you are in good health. Consult with your caregiver.   Pap test.** / Every 3 years starting at age 30 through age 65 or 70 with a history of 3 consecutive normal Pap tests.   HPV screening.** / Every 3 years from ages 30 through ages 65 to 70 with a history of 3 consecutive normal Pap tests.   Fecal occult blood test (FOBT) of stool. / Every year beginning at age 50 and continuing until age 75. You may not need to do this test if you get a colonoscopy every 10 years.   Flexible sigmoidoscopy or colonoscopy.** / Every 5 years for a flexible sigmoidoscopy or every 10 years for a colonoscopy beginning at age 50 and continuing until age 75.   Hepatitis C blood test.** / For all people born from 1945 through 1965 and any individual with known risks for hepatitis C.   Skin self-exam. / Monthly.   Influenza immunization.** / Every year.   Pneumococcal polysaccharide immunization.** / 1 to 2 doses if you smoke cigarettes or if you have certain chronic medical conditions.   Tetanus, diphtheria, pertussis (Tdap, Td) immunization.** / A one-time dose of Tdap vaccine. After that, you need a Td booster dose every 10 years.   Measles, mumps, rubella (MMR) immunization. / You need at least 1 dose of MMR if you were born in 1957 or later. You may also need a second dose.   Varicella immunization.** / Consult your caregiver.   Meningococcal immunization.** / Consult your caregiver.   Hepatitis A immunization.** / Consult your caregiver. 2 doses, 6 to 18 months apart.   Hepatitis B immunization.** / Consult your caregiver. 3 doses, usually over 6 months.  Ages 65 and over  Blood pressure check.** / Every 1 to 2 years.   Lipid and cholesterol check.** / Every 5 years beginning at age 20.   Clinical breast exam.** / Every year after age 40.   Mammogram.** / Every year beginning at age 40 and continuing for as long as you are in good health. Consult with your caregiver.   Pap test.** /  Every 3 years starting at age 30 through age 65 or 70 with a 3 consecutive normal Pap tests. Testing can be stopped between 65 and 70 with 3 consecutive normal Pap tests and no abnormal Pap or HPV tests in the past 10 years.   HPV screening.** / Every 3 years from ages 30 through ages 65 or 70 with a history of 3 consecutive normal Pap tests. Testing can be stopped between 65 and 70 with 3 consecutive normal Pap tests and no abnormal Pap or HPV tests in the past 10 years.   Fecal occult blood test (FOBT) of stool. / Every year beginning at age 50 and continuing until age 75. You may not need to do this test if you get a colonoscopy every 10 years.   Flexible sigmoidoscopy or colonoscopy.** / Every 5 years for a flexible sigmoidoscopy or every 10 years for a colonoscopy beginning at age 50 and continuing until age 75.   Hepatitis   C blood test.** / For all people born from 1 through 1963 and any individual with known risks for hepatitis C.   Osteoporosis screening.** / A one-time screening for women ages 59 and over and women at risk for fractures or osteoporosis.   Skin self-exam. / Monthly.   Influenza immunization.** / Every year.   Pneumococcal polysaccharide immunization.** / 1 dose at age 78 (or older) if you have never been vaccinated.   Tetanus, diphtheria, pertussis (Tdap, Td) immunization. / A one-time dose of Tdap vaccine if you are over 65 and have contact with an infant, are a Research scientist (physical sciences), or simply want to be protected from whooping cough. After that, you need a Td booster dose every 10 years.   Varicella immunization.** / Consult your caregiver.   Meningococcal immunization.** / Consult your caregiver.   Hepatitis A immunization.** / Consult your caregiver. 2 doses, 6 to 18 months apart.   Hepatitis B immunization.** / Check with your caregiver. 3 doses, usually over 6 months.  ** Family history and personal history of risk and conditions may change your caregiver's  recommendations. Document Released: 04/01/2001 Document Revised: 01/23/2011 Document Reviewed: 07/01/2010 [facility] Patient Information 2012 [place], [place].  MegaRed 1 krill oil cap daily, by [employer] Aspercreme and ice to knee twice a day

## 2011-08-26 NOTE — Assessment & Plan Note (Signed)
Last colonoscopy in 2009, no concerns, mild constiopation only when traveling. Encouraged increased fluids and good soluble fiber.

## 2011-08-26 NOTE — Assessment & Plan Note (Signed)
Was previously taking Valcyclovir daily and was well controlled, then she had a couple of outbreaks that were especially painful since then so she went back on an every other a day dose and has not had any outbreaks since then. She is encouraged to take a regular dose QOD and titrate as tolerated.

## 2011-08-26 NOTE — Assessment & Plan Note (Signed)
Check a urinalysis and consider mentioning it to her GYN next month and consider Kegel exercises til then

## 2011-08-26 NOTE — Progress Notes (Signed)
Patient ID: Bonnie [name], female   DOB: 08-Mar-1950, 61 y.o.   MRN: [id number] Zhuri Krass [id number] [birth year] 08/26/2011      Progress Note New Patient  Subjective  Chief Complaint  Chief Complaint  Patient presents with  . Annual Exam    HPI  Patient is a 61 year old Caucasian female who is in today for an exam. Overall she's doing well but does have a couple concerns. She has tried to stop valacyclovir secondary to no outbreaks in a number of years but unfortunately when she did that she's had a couple painful outbreaks. She is now back to taking roughly 3-4 doses weekly not had any further outbreaks. She's also noting some nocturia. She denies polyuria or any daytime symptoms. She gets up quickly and often feels as if she has to void again in a few minutes. No dysuria, back pain or abdominal pain. Has intermittent trouble to right knee especially with exertion and hiking where she has to twist a lot. The pain is always over the right lateral meniscus and sharp with rest it resolves. No swelling or redness is noted. Has some similar symptoms in the left but not as severe. Is complaining of some occasional discharge itching and discomfort in the left ear. No decreased hearing or tinnitus. No fevers, chills, chest pain, palpitations, shortness of breath, GI complaints.   Past Medical History  Diagnosis Date  . Chicken pox child  . Measles as a child  . Genital herpes in women 02-17-09  . Lactose intolerance     ?  . Multiple allergies 08/02/2010  . Neutropenia 08/02/2010  . Neck pain, acute 12/11/2010  . Polyuria 08/26/2011  . Knee pain, right 08/26/2011    Past Surgical History  Procedure Date  . Cataract surgery 4-12 and 5-12  . Broken leg 6 months old  . Teeth pulled 61 yrs old  . Cosmetic surgery 61 yrs old    nose    Family History  Problem Relation Age of Onset  . Heart disease Father     passed after by pass surgery  . Hypertension Father   . Hyperlipidemia Father   . Other  Sister     auto immune issues  . Gout Sister   . Autoimmune disease Sister     fatigue, rashes  . Osteopenia Sister   . Osteoporosis Mother     History   Social History  . Marital Status: Married    Spouse Name: N/A    Number of Children: N/A  . Years of Education: N/A   Occupational History  . Not on file.   Social History Main Topics  . Smoking status: Never Smoker   . Smokeless tobacco: Never Used  . Alcohol Use: No     1-2 beers a week or 1-2 glasses of wine a week  . Drug Use: No  . Sexually Active: Yes -- Female partner(s)   Other Topics Concern  . Not on file   Social History Narrative  . No narrative on file    Current Outpatient Prescriptions on File Prior to Visit  Medication Sig Dispense Refill  . Calcium Carbonate-Vitamin D (CALCIUM-VITAMIN D) 500-200 MG-UNIT per tablet Take 1 tablet by mouth daily.        . multivitamin (THERAGRAN) tablet Take 1 tablet by mouth daily.          No Known Allergies  Review of Systems  Review of Systems  Constitutional: Negative for fever, chills and malaise/fatigue.  HENT: Positive  for ear pain, congestion and ear discharge. Negative for hearing loss and nosebleeds.   Eyes: Negative for discharge.  Respiratory: Negative for cough, sputum production, shortness of breath and wheezing.   Cardiovascular: Negative for chest pain, palpitations and leg swelling.  Gastrointestinal: Negative for heartburn, nausea, vomiting, abdominal pain, diarrhea, constipation and blood in stool.  Genitourinary: Negative for dysuria, urgency, frequency and hematuria.  Musculoskeletal: Positive for joint pain. Negative for myalgias, back pain and falls.       R>L knee pain  Skin: Negative for rash.  Neurological: Negative for dizziness, tremors, sensory change, focal weakness, loss of consciousness, weakness and headaches.  Endo/Heme/Allergies: Negative for polydipsia. Does not bruise/bleed easily.  Psychiatric/Behavioral: Negative for  depression and suicidal ideas. The patient is not nervous/anxious and does not have insomnia.     Objective  BP 112/80  Pulse 68  Temp 98 F (36.7 C) (Oral)  Resp 18  Wt 122 lb 1.3 oz (55.375 kg)  SpO2 98%  Physical Exam  Physical Exam  Constitutional: She is oriented to person, place, and time and well-developed, well-nourished, and in no distress. No distress.  HENT:  Head: Normocephalic and atraumatic.  Right Ear: External ear normal.  Left Ear: External ear normal.  Nose: Nose normal.  Mouth/Throat: Oropharynx is clear and moist. No oropharyngeal exudate.  Eyes: Conjunctivae are normal. Pupils are equal, round, and reactive to light. Right eye exhibits no discharge. Left eye exhibits no discharge. No scleral icterus.  Neck: Normal range of motion. Neck supple. No thyromegaly present.  Cardiovascular: Normal rate, regular rhythm, normal heart sounds and intact distal pulses.   No murmur heard. Pulmonary/Chest: Effort normal and breath sounds normal. No respiratory distress. She has no wheezes. She has no rales.  Abdominal: Soft. Bowel sounds are normal. She exhibits no distension and no mass. There is no tenderness.  Musculoskeletal: Normal range of motion. She exhibits no edema and no tenderness.  Lymphadenopathy:    She has no cervical adenopathy.  Neurological: She is alert and oriented to person, place, and time. She has normal reflexes. No cranial nerve deficit. Coordination normal.  Skin: Skin is warm and dry. No rash noted. She is not diaphoretic.  Psychiatric: Mood, memory and affect normal.       Assessment & Plan  Genital herpes in women Was previously taking Valcyclovir daily and was well controlled, then she had a couple of outbreaks that were especially painful since then so she went back on an every other a day dose and has not had any outbreaks since then. She is encouraged to take a regular dose QOD and titrate as tolerated.  Polyuria Check a  urinalysis and consider mentioning it to her GYN next month and consider Kegel exercises til then  Neck pain, acute No c/o today  History of colon polyps Last colonoscopy in 2009, no concerns, mild constiopation only when traveling. Encouraged increased fluids and good soluble fiber.   Multiple allergies No significant symptoms noted today  Knee pain, right Over lateral meniscus with excessive use and twisting, encouraged soft brace especially when hiking, ice and Aspercreme, start a MegaRed krill oil cap daily and report if no improvement  Preventative health care Needs next colonoscopy in 2019, has a gynecologist she sees regularly. Check fasting labs and encouraged a heart healthy diet.  Otitis externa of left ear Vosol HC is prescribed

## 2011-08-26 NOTE — Assessment & Plan Note (Signed)
Vosol HC is prescribed

## 2011-08-27 LAB — TSH: TSH: 2.02 u[IU]/mL (ref 0.35–5.50)

## 2011-08-29 ENCOUNTER — Encounter: Payer: Self-pay | Admitting: Family Medicine

## 2011-09-09 ENCOUNTER — Telehealth: Payer: Self-pay

## 2011-09-09 MED ORDER — NEOMYCIN-POLYMYXIN-HC 3.5-10000-1 OT SOLN: 4.0000 [drp] | Freq: Three times a day (TID) | OTIC | Status: DC

## 2011-09-09 NOTE — Telephone Encounter (Signed)
Patient called stating she was seen 08-26-11 for an outer ear infection and started feeling better so she stopped using the drops. Patient said she started feeling bad again so she started the drops again. Patient stated she is going to be on a plane and wants to know if she should try another medication or does she need to give it time? Please advise? Leave a message on patients vm at work

## 2011-09-09 NOTE — Telephone Encounter (Signed)
OK to rx Cortisporin Otic 4 drops to ear tid x 10 full days, disp # 1 bottle and no rf

## 2011-09-09 NOTE — Telephone Encounter (Signed)
Patient informed and RX sent to [facility] per patients request

## 2011-09-11 ENCOUNTER — Telehealth: Payer: Self-pay

## 2011-09-11 NOTE — Telephone Encounter (Signed)
OK to use drops on the plane, she is OK to take a shower but be careful not to get too much water in them in the pool. OK to give her a refill with same sig and 1 bottle

## 2011-09-11 NOTE — Telephone Encounter (Signed)
Patient left a message stating: - she is going to be flying for a "long time" and would like to know if she should do the ear drops 1 to 2 hours before flying or is it ok to use the drops on the plane? -pt is also going [place] in another country, so should she get a refill for the drops (if so she needs a new RX sent to pharmacy)? -pt would also like to know if its ok to get water in her ears (with a shower, pool, or ocean?  Pts message stated to give her a call today or tomorrow (09-12-11)  Please advise?

## 2011-09-12 MED ORDER — NEOMYCIN-POLYMYXIN-HC 3.5-10000-1 OT SOLN: 4.0000 [drp] | Freq: Three times a day (TID) | OTIC | Status: DC

## 2011-09-12 NOTE — Telephone Encounter (Signed)
Left a detailed message on patients work phone. RX sent to pharmacy

## 2011-09-18 LAB — HM PAP SMEAR: HM Pap smear: NORMAL

## 2011-09-18 LAB — HM MAMMOGRAPHY: HM Mammogram: NORMAL

## 2012-02-18 DIAGNOSIS — M8580 Other specified disorders of bone density and structure, unspecified site: Secondary | ICD-10-CM

## 2012-02-18 DIAGNOSIS — R7303 Prediabetes: Secondary | ICD-10-CM

## 2012-02-18 HISTORY — DX: Other specified disorders of bone density and structure, unspecified site: M85.80

## 2012-02-18 HISTORY — DX: Prediabetes: R73.03

## 2012-02-20 ENCOUNTER — Telehealth: Payer: Self-pay

## 2012-02-20 MED ORDER — METRONIDAZOLE 500 MG PO TABS: 500.0000 mg | ORAL_TABLET | Freq: Two times a day (BID) | ORAL | Status: DC

## 2012-02-20 NOTE — Telephone Encounter (Signed)
Patient left a message stating that she knows for sure that she has a vaginal bacteria infection.  Pt stated she would like something called in and if MD won't do it to let her know and she will call her OB/GYN. Please advise? Callback number [phone]

## 2012-02-20 NOTE — Telephone Encounter (Signed)
Am willing to treat once but if no improvement she should come in, make sure she takes a probiotic during and after treatment to help with her flora. Flagyl 500 mg po bid x 5 days

## 2012-02-20 NOTE — Telephone Encounter (Signed)
Patient informed and RX sent.  

## 2012-04-03 ENCOUNTER — Other Ambulatory Visit: Payer: Self-pay

## 2012-05-25 ENCOUNTER — Telehealth: Payer: Self-pay | Admitting: Family Medicine

## 2012-05-25 ENCOUNTER — Other Ambulatory Visit: Payer: Self-pay | Admitting: Family Medicine

## 2012-05-25 DIAGNOSIS — R739 Hyperglycemia, unspecified: Secondary | ICD-10-CM

## 2012-05-25 NOTE — Telephone Encounter (Signed)
Patient would like a referral to a dietician. She states that she is concerned about how high her pre-diabetes level are.

## 2012-05-25 NOTE — Telephone Encounter (Signed)
Referral placed.

## 2012-05-25 NOTE — Telephone Encounter (Signed)
Patient informed, understood & agreed/SLS  

## 2012-06-23 ENCOUNTER — Encounter: Payer: Self-pay | Admitting: *Deleted

## 2012-06-23 ENCOUNTER — Encounter: Payer: Commercial Indemnity | Attending: Family Medicine | Admitting: *Deleted

## 2012-06-23 DIAGNOSIS — Z713 Dietary counseling and surveillance: Secondary | ICD-10-CM | POA: Insufficient documentation

## 2012-06-23 DIAGNOSIS — R7309 Other abnormal glucose: Secondary | ICD-10-CM | POA: Insufficient documentation

## 2012-06-23 NOTE — Patient Instructions (Signed)
Plan:  Aim for 4 Carb Choices per meal (60 grams) +/- 1 either way  Aim for 0-2 Carbs per snack if hungry  Consider reading food labels for Total Carbohydrate of foods Continue with your current your activity level as tolerated

## 2012-06-23 NOTE — Progress Notes (Signed)
  Medical Nutrition Therapy:  Appt start time: 0900 end time:  1000.  Assessment:  Primary concerns today: patient here for hyperglycemia. She was tested in June, 2013 and A1c then was 6.0% and she would like more information on pre-diabetes and food options. 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 She shops and prepares most meals. Exercises close to 150 minutes a week, slow running, goes to gym and also Zumba once a week. Enjoys being outside. Was eating white rice, white potatoes, corn and more pasta than she is eating now.  MEDICATIONS: see list. No medications for BG to date   DIETARY INTAKE:  Usual eating pattern includes 3 meals and 0-2 snacks per day.  Everyday foods include good variety of all food groups.  Avoided foods include caffeine, fried foods, lactose, peppers.    24-hr recall:  B ( AM): oatmeal with flax and quinoa and berries OR cold cereal, OR eggs, toast or cereal, always OJ - has decreased from 16 oz to 8 oz  Snk ( AM): not usually  L ( PM): sandwich with meat, avocado and raw vegetables, fresh fruit, water or decaf tea Snk ( PM): [place] bar OR teddy grahams D ( PM): lean meat, vegetables and occasionally a small serving of starch Snk ( PM): chocolate - usually dark, miniature pieces Beverages: water, OJ, hot tea  Usual physical activity: Exercises close to 150 minutes a week, slow running, goes to gym and also Zumba once a week. Enjoys being outside.    Estimated energy needs:  2100 calories 235 g carbohydrates 158 g protein 58 g fat  Progress Towards Goal(s):  In progress.   Nutritional Diagnosis:  NB-1.1 Food and nutrition-related knowledge deficit As related to pre-diabetes.  As evidenced by A1c in July 2013 of 6.0%.    Intervention:  Nutrition counseling and pre- diabetes education initiated. Discussed basic physiology of pre-diabetes, A1c, Carb Counting and reading food labels, and benefits of continued  activity.  Plan:  Aim for 4 Carb Choices per meal (60 grams) +/- 1 either way  Aim for 0-2 Carbs per snack if hungry  Consider reading food labels for Total Carbohydrate of foods Continue with your current your activity level as tolerated  Handouts given during visit include: Living Well with Diabetes Carb Counting and Food Label handouts Meal Plan Card  Monitoring/Evaluation:  Dietary intake, exercise, reading food labels, and body weight prn.

## 2012-07-27 ENCOUNTER — Telehealth: Payer: Self-pay | Admitting: Family Medicine

## 2012-07-27 DIAGNOSIS — E785 Hyperlipidemia, unspecified: Secondary | ICD-10-CM

## 2012-07-27 DIAGNOSIS — Z0000 Encounter for general adult medical examination without abnormal findings: Secondary | ICD-10-CM

## 2012-07-27 DIAGNOSIS — R739 Hyperglycemia, unspecified: Secondary | ICD-10-CM

## 2012-07-27 NOTE — Telephone Encounter (Signed)
Labs lipid, renal, cbc, tsh, hepatic, hgba1c for preventative, hyperglycemia, hyperlipidemia

## 2012-07-27 NOTE — Telephone Encounter (Signed)
Please advise on labs.

## 2012-07-27 NOTE — Telephone Encounter (Signed)
Patient has cpe on 09/16/12 but will be going to OR lab one week prior. Please order labs.

## 2012-07-28 NOTE — Telephone Encounter (Signed)
Labs ordered.

## 2012-08-30 LAB — HM DEXA SCAN

## 2012-09-02 ENCOUNTER — Other Ambulatory Visit: Payer: Commercial Indemnity

## 2012-09-06 ENCOUNTER — Other Ambulatory Visit: Payer: Commercial Indemnity

## 2012-09-09 ENCOUNTER — Other Ambulatory Visit: Payer: Commercial Indemnity

## 2012-09-10 ENCOUNTER — Telehealth: Payer: Self-pay

## 2012-09-10 ENCOUNTER — Ambulatory Visit (INDEPENDENT_AMBULATORY_CARE_PROVIDER_SITE_OTHER)
Admission: RE | Admit: 2012-09-10 | Discharge: 2012-09-10 | Disposition: A | Discharge status: Home / Self Care | Payer: Commercial Indemnity | Source: Ambulatory Visit | Attending: Family Medicine | Admitting: Family Medicine

## 2012-09-10 DIAGNOSIS — Z0000 Encounter for general adult medical examination without abnormal findings: Secondary | ICD-10-CM

## 2012-09-10 LAB — HM DEXA SCAN

## 2012-09-10 NOTE — Telephone Encounter (Signed)
Bone density ordered.

## 2012-09-15 ENCOUNTER — Other Ambulatory Visit (INDEPENDENT_AMBULATORY_CARE_PROVIDER_SITE_OTHER): Payer: Commercial Indemnity

## 2012-09-15 DIAGNOSIS — Z0000 Encounter for general adult medical examination without abnormal findings: Secondary | ICD-10-CM

## 2012-09-15 DIAGNOSIS — R739 Hyperglycemia, unspecified: Secondary | ICD-10-CM

## 2012-09-15 DIAGNOSIS — E785 Hyperlipidemia, unspecified: Secondary | ICD-10-CM

## 2012-09-15 NOTE — Progress Notes (Signed)
Labs Only

## 2012-09-16 ENCOUNTER — Ambulatory Visit (INDEPENDENT_AMBULATORY_CARE_PROVIDER_SITE_OTHER): Payer: Commercial Indemnity | Admitting: Family Medicine

## 2012-09-16 ENCOUNTER — Encounter: Payer: Self-pay | Admitting: Family Medicine

## 2012-09-16 VITALS — BP 110/70 | HR 75 | Temp 97.8°F | Ht 64.25 in

## 2012-09-16 DIAGNOSIS — E785 Hyperlipidemia, unspecified: Secondary | ICD-10-CM

## 2012-09-16 DIAGNOSIS — Z23 Encounter for immunization: Secondary | ICD-10-CM

## 2012-09-16 DIAGNOSIS — M899 Disorder of bone, unspecified: Secondary | ICD-10-CM

## 2012-09-16 DIAGNOSIS — M542 Cervicalgia: Secondary | ICD-10-CM

## 2012-09-16 DIAGNOSIS — M8580 Other specified disorders of bone density and structure, unspecified site: Secondary | ICD-10-CM

## 2012-09-16 DIAGNOSIS — A6009 Herpesviral infection of other urogenital tract: Secondary | ICD-10-CM

## 2012-09-16 DIAGNOSIS — A6000 Herpesviral infection of urogenital system, unspecified: Secondary | ICD-10-CM

## 2012-09-16 DIAGNOSIS — M949 Disorder of cartilage, unspecified: Secondary | ICD-10-CM

## 2012-09-16 DIAGNOSIS — E860 Dehydration: Secondary | ICD-10-CM

## 2012-09-16 DIAGNOSIS — Z0000 Encounter for general adult medical examination without abnormal findings: Secondary | ICD-10-CM

## 2012-09-16 MED ORDER — ZOSTER VACCINE LIVE 19400 UNT/0.65ML ~~LOC~~ SOLR: 0.6500 mL | Freq: Once | SUBCUTANEOUS | Status: DC

## 2012-09-16 MED ORDER — ASPIRIN EC 81 MG PO TBEC: 81.0000 mg | DELAYED_RELEASE_TABLET | Freq: Every day | ORAL | Status: DC

## 2012-09-16 NOTE — Assessment & Plan Note (Addendum)
Fasting labs today,encouraged heart healthy diet, avoid trans fats, get 8 hours of sleep a night. Increase exercise

## 2012-09-16 NOTE — Assessment & Plan Note (Signed)
Once again dehydrated, encouraged 64 oz of clear fluids

## 2012-09-16 NOTE — Assessment & Plan Note (Signed)
Frequent outbreaks this year. Only taking 1/2 a Valtrex M,W,F. Encouraged to take daily when she has frequent outbreaks

## 2012-09-16 NOTE — Patient Instructions (Addendum)
Consider Aspercreme or Salon Pas cream as needed for pain   Preventive Care for Adults, Female A healthy lifestyle and preventive care can promote health and wellness. Preventive health guidelines for women include the following key practices.  A routine yearly physical is a good way to check with your caregiver about your health and preventive screening. It is a chance to share any concerns and updates on your health, and to receive a thorough exam.  Visit your dentist for a routine exam and preventive care every 6 months. Brush your teeth twice a day and floss once a day. Good oral hygiene prevents tooth decay and gum disease.  The frequency of eye exams is based on your age, health, family medical history, use of contact lenses, and other factors. Follow your caregiver's recommendations for frequency of eye exams.  Eat a healthy diet. Foods like vegetables, fruits, whole grains, low-fat dairy products, and lean protein foods contain the nutrients you need without too many calories. Decrease your intake of foods high in solid fats, added sugars, and salt. Eat the right amount of calories for you.Get information about a proper diet from your caregiver, if necessary.  Regular physical exercise is one of the most important things you can do for your health. Most adults should get at least 150 minutes of moderate-intensity exercise (any activity that increases your heart rate and causes you to sweat) each week. In addition, most adults need muscle-strengthening exercises on 2 or more days a week.  Maintain a healthy weight. The body mass index (BMI) is a screening tool to identify possible weight problems. It provides an estimate of body fat based on height and weight. Your caregiver can help determine your BMI, and can help you achieve or maintain a healthy weight.For adults 20 years and older:  A BMI below 18.5 is considered underweight.  A BMI of 18.5 to 24.9 is normal.  A BMI of 25 to 29.9  is considered overweight.  A BMI of 30 and above is considered obese.  Maintain normal blood lipids and cholesterol levels by exercising and minimizing your intake of saturated fat. Eat a balanced diet with plenty of fruit and vegetables. Blood tests for lipids and cholesterol should begin at age 34 and be repeated every 5 years. If your lipid or cholesterol levels are high, you are over 50, or you are at high risk for heart disease, you may need your cholesterol levels checked more frequently.Ongoing high lipid and cholesterol levels should be treated with medicines if diet and exercise are not effective.  If you smoke, find out from your caregiver how to quit. If you do not use tobacco, do not start.  If you are pregnant, do not drink alcohol. If you are breastfeeding, be very cautious about drinking alcohol. If you are not pregnant and choose to drink alcohol, do not exceed 1 drink per day. One drink is considered to be 12 ounces (355 mL) of beer, 5 ounces (148 mL) of wine, or 1.5 ounces (44 mL) of liquor.  Avoid use of street drugs. Do not share needles with anyone. Ask for help if you need support or instructions about stopping the use of drugs.  High blood pressure causes heart disease and increases the risk of stroke. Your blood pressure should be checked at least every 1 to 2 years. Ongoing high blood pressure should be treated with medicines if weight loss and exercise are not effective.  If you are 55 to 60 years old, ask  your caregiver if you should take aspirin to prevent strokes.  Diabetes screening involves taking a blood sample to check your fasting blood sugar level. This should be done once every 3 years, after age 107, if you are within normal weight and without risk factors for diabetes. Testing should be considered at a younger age or be carried out more frequently if you are overweight and have at least 1 risk factor for diabetes.  Breast cancer screening is essential  preventive care for women. You should practice "breast self-awareness." This means understanding the normal appearance and feel of your breasts and may include breast self-examination. Any changes detected, no matter how small, should be reported to a caregiver. Women in their 24s and 30s should have a clinical breast exam (CBE) by a caregiver as part of a regular health exam every 1 to 3 years. After age 68, women should have a CBE every year. Starting at age 76, women should consider having a mammography (breast X-ray test) every year. Women who have a family history of breast cancer should talk to their caregiver about genetic screening. Women at a high risk of breast cancer should talk to their caregivers about having magnetic resonance imaging (MRI) and a mammography every year.  The Pap test is a screening test for cervical cancer. A Pap test can show cell changes on the cervix that might become cervical cancer if left untreated. A Pap test is a procedure in which cells are obtained and examined from the lower end of the uterus (cervix).  Women should have a Pap test starting at age 73.  Between ages 22 and 5, Pap tests should be repeated every 2 years.  Beginning at age 75, you should have a Pap test every 3 years as long as the past 3 Pap tests have been normal.  Some women have medical problems that increase the chance of getting cervical cancer. Talk to your caregiver about these problems. It is especially important to talk to your caregiver if a new problem develops soon after your last Pap test. In these cases, your caregiver may recommend more frequent screening and Pap tests.  The above recommendations are the same for women who have or have not gotten the vaccine for human papillomavirus (HPV).  If you had a hysterectomy for a problem that was not cancer or a condition that could lead to cancer, then you no longer need Pap tests. Even if you no longer need a Pap test, a regular exam is  a good idea to make sure no other problems are starting.  If you are between ages 67 and 40, and you have had normal Pap tests going back 10 years, you no longer need Pap tests. Even if you no longer need a Pap test, a regular exam is a good idea to make sure no other problems are starting.  If you have had past treatment for cervical cancer or a condition that could lead to cancer, you need Pap tests and screening for cancer for at least 20 years after your treatment.  If Pap tests have been discontinued, risk factors (such as a new sexual partner) need to be reassessed to determine if screening should be resumed.  The HPV test is an additional test that may be used for cervical cancer screening. The HPV test looks for the virus that can cause the cell changes on the cervix. The cells collected during the Pap test can be tested for HPV. The HPV test could  be used to screen women aged 93 years and older, and should be used in women of any age who have unclear Pap test results. After the age of 33, women should have HPV testing at the same frequency as a Pap test.  Colorectal cancer can be detected and often prevented. Most routine colorectal cancer screening begins at the age of 70 and continues through age 67. However, your caregiver may recommend screening at an earlier age if you have risk factors for colon cancer. On a yearly basis, your caregiver may provide home test kits to check for hidden blood in the stool. Use of a small camera at the end of a tube, to directly examine the colon (sigmoidoscopy or colonoscopy), can detect the earliest forms of colorectal cancer. Talk to your caregiver about this at age 6, when routine screening begins. Direct examination of the colon should be repeated every 5 to 10 years through age 40, unless early forms of pre-cancerous polyps or small growths are found.  Hepatitis C blood testing is recommended for all people born from [birth year] through 1965 and any individual  with known risks for hepatitis C.  Practice safe sex. Use condoms and avoid high-risk sexual practices to reduce the spread of sexually transmitted infections (STIs). STIs include gonorrhea, chlamydia, syphilis, trichomonas, herpes, HPV, and human immunodeficiency virus (HIV). Herpes, HIV, and HPV are viral illnesses that have no cure. They can result in disability, cancer, and death. Sexually active women aged 44 and younger should be checked for chlamydia. Older women with new or multiple partners should also be tested for chlamydia. Testing for other STIs is recommended if you are sexually active and at increased risk.  Osteoporosis is a disease in which the bones lose minerals and strength with aging. This can result in serious bone fractures. The risk of osteoporosis can be identified using a bone density scan. Women ages 46 and over and women at risk for fractures or osteoporosis should discuss screening with their caregivers. Ask your caregiver whether you should take a calcium supplement or vitamin D to reduce the rate of osteoporosis.  Menopause can be associated with physical symptoms and risks. Hormone replacement therapy is available to decrease symptoms and risks. You should talk to your caregiver about whether hormone replacement therapy is right for you.  Use sunscreen with sun protection factor (SPF) of 30 or more. Apply sunscreen liberally and repeatedly throughout the day. You should seek shade when your shadow is shorter than you. Protect yourself by wearing long sleeves, pants, a wide-brimmed hat, and sunglasses year round, whenever you are outdoors.  Once a month, do a whole body skin exam, using a mirror to look at the skin on your back. Notify your caregiver of new moles, moles that have irregular borders, moles that are larger than a pencil eraser, or moles that have changed in shape or color.  Stay current with required immunizations.  Influenza. You need a dose every fall (or  winter). The composition of the flu vaccine changes each year, so being vaccinated once is not enough.  Pneumococcal polysaccharide. You need 1 to 2 doses if you smoke cigarettes or if you have certain chronic medical conditions. You need 1 dose at age 81 (or older) if you have never been vaccinated.  Tetanus, diphtheria, pertussis (Tdap, Td). Get 1 dose of Tdap vaccine if you are younger than age 33, are over 92 and have contact with an infant, are a Research scientist (physical sciences), are pregnant, or simply want  to be protected from whooping cough. After that, you need a Td booster dose every 10 years. Consult your caregiver if you have not had at least 3 tetanus and diphtheria-containing shots sometime in your life or have a deep or dirty wound.  HPV. You need this vaccine if you are a woman age 52 or younger. The vaccine is given in 3 doses over 6 months.  Measles, mumps, rubella (MMR). You need at least 1 dose of MMR if you were born in 1957 or later. You may also need a second dose.  Meningococcal. If you are age 53 to 6 and a first-year college student living in a residence hall, or have one of several medical conditions, you need to get vaccinated against meningococcal disease. You may also need additional booster doses.  Zoster (shingles). If you are age 46 or older, you should get this vaccine.  Varicella (chickenpox). If you have never had chickenpox or you were vaccinated but received only 1 dose, talk to your caregiver to find out if you need this vaccine.  Hepatitis A. You need this vaccine if you have a specific risk factor for hepatitis A virus infection or you simply wish to be protected from this disease. The vaccine is usually given as 2 doses, 6 to 18 months apart.  Hepatitis B. You need this vaccine if you have a specific risk factor for hepatitis B virus infection or you simply wish to be protected from this disease. The vaccine is given in 3 doses, usually over 6 months. Preventive  Services / Frequency Ages 50 to 10  Blood pressure check.** / Every 1 to 2 years.  Lipid and cholesterol check.** / Every 5 years beginning at age 83.  Clinical breast exam.** / Every 3 years for women in their 14s and 30s.  Pap test.** / Every 2 years from ages 6 through 59. Every 3 years starting at age 43 through age 32 or 31 with a history of 3 consecutive normal Pap tests.  HPV screening.** / Every 3 years from ages 62 through ages 22 to 2 with a history of 3 consecutive normal Pap tests.  Hepatitis C blood test.** / For any individual with known risks for hepatitis C.  Skin self-exam. / Monthly.  Influenza immunization.** / Every year.  Pneumococcal polysaccharide immunization.** / 1 to 2 doses if you smoke cigarettes or if you have certain chronic medical conditions.  Tetanus, diphtheria, pertussis (Tdap, Td) immunization. / A one-time dose of Tdap vaccine. After that, you need a Td booster dose every 10 years.  HPV immunization. / 3 doses over 6 months, if you are 70 and younger.  Measles, mumps, rubella (MMR) immunization. / You need at least 1 dose of MMR if you were born in 1957 or later. You may also need a second dose.  Meningococcal immunization. / 1 dose if you are age 35 to 81 and a first-year college student living in a residence hall, or have one of several medical conditions, you need to get vaccinated against meningococcal disease. You may also need additional booster doses.  Varicella immunization.** / Consult your caregiver.  Hepatitis A immunization.** / Consult your caregiver. 2 doses, 6 to 18 months apart.  Hepatitis B immunization.** / Consult your caregiver. 3 doses usually over 6 months. Ages 88 to 74  Blood pressure check.** / Every 1 to 2 years.  Lipid and cholesterol check.** / Every 5 years beginning at age 57.  Clinical breast exam.** / Every year  after age 24.  Mammogram.** / Every year beginning at age 40 and continuing for as long as you  are in good health. Consult with your caregiver.  Pap test.** / Every 3 years starting at age 71 through age 37 or 10 with a history of 3 consecutive normal Pap tests.  HPV screening.** / Every 3 years from ages 2 through ages 73 to 59 with a history of 3 consecutive normal Pap tests.  Fecal occult blood test (FOBT) of stool. / Every year beginning at age 64 and continuing until age 17. You may not need to do this test if you get a colonoscopy every 10 years.  Flexible sigmoidoscopy or colonoscopy.** / Every 5 years for a flexible sigmoidoscopy or every 10 years for a colonoscopy beginning at age 40 and continuing until age 90.  Hepatitis C blood test.** / For all people born from [birth year] through 1963 and any individual with known risks for hepatitis C.  Skin self-exam. / Monthly.  Influenza immunization.** / Every year.  Pneumococcal polysaccharide immunization.** / 1 to 2 doses if you smoke cigarettes or if you have certain chronic medical conditions.  Tetanus, diphtheria, pertussis (Tdap, Td) immunization.** / A one-time dose of Tdap vaccine. After that, you need a Td booster dose every 10 years.  Measles, mumps, rubella (MMR) immunization. / You need at least 1 dose of MMR if you were born in 1957 or later. You may also need a second dose.  Varicella immunization.** / Consult your caregiver.  Meningococcal immunization.** / Consult your caregiver.  Hepatitis A immunization.** / Consult your caregiver. 2 doses, 6 to 18 months apart.  Hepatitis B immunization.** / Consult your caregiver. 3 doses, usually over 6 months. Ages 8 and over  Blood pressure check.** / Every 1 to 2 years.  Lipid and cholesterol check.** / Every 5 years beginning at age 63.  Clinical breast exam.** / Every year after age 84.  Mammogram.** / Every year beginning at age 89 and continuing for as long as you are in good health. Consult with your caregiver.  Pap test.** / Every 3 years starting at age 69  through age 72 or 24 with a 3 consecutive normal Pap tests. Testing can be stopped between 65 and 70 with 3 consecutive normal Pap tests and no abnormal Pap or HPV tests in the past 10 years.  HPV screening.** / Every 3 years from ages 47 through ages 32 or 28 with a history of 3 consecutive normal Pap tests. Testing can be stopped between 65 and 70 with 3 consecutive normal Pap tests and no abnormal Pap or HPV tests in the past 10 years.  Fecal occult blood test (FOBT) of stool. / Every year beginning at age 27 and continuing until age 65. You may not need to do this test if you get a colonoscopy every 10 years.  Flexible sigmoidoscopy or colonoscopy.** / Every 5 years for a flexible sigmoidoscopy or every 10 years for a colonoscopy beginning at age 89 and continuing until age 48.  Hepatitis C blood test.** / For all people born from [birth year] through 1963 and any individual with known risks for hepatitis C.  Osteoporosis screening.** / A one-time screening for women ages 89 and over and women at risk for fractures or osteoporosis.  Skin self-exam. / Monthly.  Influenza immunization.** / Every year.  Pneumococcal polysaccharide immunization.** / 1 dose at age 91 (or older) if you have never been vaccinated.  Tetanus, diphtheria, pertussis (Tdap, Td)  immunization. / A one-time dose of Tdap vaccine if you are over 65 and have contact with an infant, are a Research scientist (physical sciences), or simply want to be protected from whooping cough. After that, you need a Td booster dose every 10 years.  Varicella immunization.** / Consult your caregiver.  Meningococcal immunization.** / Consult your caregiver.  Hepatitis A immunization.** / Consult your caregiver. 2 doses, 6 to 18 months apart.  Hepatitis B immunization.** / Check with your caregiver. 3 doses, usually over 6 months. ** Family history and personal history of risk and conditions may change your caregiver's recommendations. Document Released: 04/01/2001  Document Revised: 04/28/2011 Document Reviewed: 07/01/2010 [facility] Patient Information 2014 [place], [place].

## 2012-09-16 NOTE — Assessment & Plan Note (Signed)
encouraged stretching and Aspercreme

## 2012-09-17 NOTE — Progress Notes (Signed)
Patient ID: Bonnie [name], female   DOB: 1950-11-11, 62 y.o.   MRN: [id number] Bonnie [name] [id number] [birth year] 09/17/2012      Progress Note-Follow Up  Subjective  Chief Complaint  Chief Complaint  Patient presents with  . Annual Exam    physical    HPI  Patient is a 62 year old Caucasian female in today for annual exam. She reports she's feeling well and she runs several days a week  several days the area she's not had any recent illness except for some recurrent viable lesions. She's only been taking her Valtrex 3 days a week. No fevers and chills. No shortness of breath, palpitations, chest pain, GI or GU concerns.  Past Medical History  Diagnosis Date  . Chicken pox child  . Measles as a child  . Genital herpes in women 02-17-09  . Lactose intolerance     ?  . Multiple allergies 08/02/2010  . Neutropenia 08/02/2010  . Neck pain, acute 12/11/2010  . Polyuria 08/26/2011  . Knee pain, right 08/26/2011  . Otitis externa of left ear 08/26/2011    Past Surgical History  Procedure Laterality Date  . Cataract surgery  4-12 and 5-12  . Broken leg  6 months old  . Teeth pulled  62 yrs old  . Cosmetic surgery  62 yrs old    nose    Family History  Problem Relation Age of Onset  . Heart disease Father     passed after by pass surgery  . Hypertension Father   . Hyperlipidemia Father   . Other Sister     auto immune issues  . Gout Sister   . Autoimmune disease Sister     fatigue, rashes  . Osteopenia Sister   . Osteoporosis Mother     History   Social History  . Marital Status: Married    Spouse Name: N/A    Number of Children: N/A  . Years of Education: N/A   Occupational History  . Not on file.   Social History Main Topics  . Smoking status: Never Smoker   . Smokeless tobacco: Never Used  . Alcohol Use: No     Comment: 1-2 beers a week or 1-2 glasses of wine a week  . Drug Use: No  . Sexually Active: Yes -- Female partner(s)   Other Topics Concern  . Not on file    Social History Narrative  . No narrative on file    Current Outpatient Prescriptions on File Prior to Visit  Medication Sig Dispense Refill  . Calcium Carbonate-Vitamin D (CALCIUM-VITAMIN D) 500-200 MG-UNIT per tablet Take 1 tablet by mouth daily.        . multivitamin (THERAGRAN) tablet Take 1 tablet by mouth daily. One a day      . metroNIDAZOLE (FLAGYL) 500 MG tablet Take 1 tablet (500 mg total) by mouth 2 (two) times daily.  10 tablet  0   No current facility-administered medications on file prior to visit.    Allergies  Allergen Reactions  . Cyclorex (Cyclandelate)     Review of Systems  .Review of Systems  Constitutional: Negative for fever, chills and malaise/fatigue.  HENT: Negative for hearing loss, nosebleeds and congestion.   Eyes: Negative for pain and discharge.  Respiratory: Negative for cough, sputum production, shortness of breath and wheezing.   Cardiovascular: Negative for chest pain, palpitations and leg swelling.  Gastrointestinal: Negative for heartburn, nausea, vomiting, abdominal pain, diarrhea, constipation and blood in stool.  Genitourinary: Negative for  dysuria, urgency, frequency and hematuria.  Musculoskeletal: Negative for myalgias, back pain and falls.  Skin: Negative for rash.  Neurological: Negative for dizziness, tremors, sensory change, focal weakness, loss of consciousness, weakness and headaches.  Endo/Heme/Allergies: Negative for polydipsia. Does not bruise/bleed easily.  Psychiatric/Behavioral: Negative for depression and suicidal ideas. The patient is not nervous/anxious and does not have insomnia.     Objective  BP 110/70  Pulse 75  Temp(Src) 97.8 F (36.6 C) (Oral)  Ht 5' 4.25" (1.632 m)  SpO2 96%  Physical Exam  Physical Exam  Constitutional: She is oriented to person, place, and time and well-developed, well-nourished, and in no distress. No distress.  HENT:  Head: Normocephalic and atraumatic.  Right Ear: External ear  normal.  Left Ear: External ear normal.  Nose: Nose normal.  Mouth/Throat: Oropharynx is clear and moist. No oropharyngeal exudate.  Eyes: Conjunctivae are normal. Pupils are equal, round, and reactive to light. Right eye exhibits no discharge. Left eye exhibits no discharge. No scleral icterus.  Neck: Normal range of motion. Neck supple. No thyromegaly present.  Cardiovascular: Normal rate, regular rhythm, normal heart sounds and intact distal pulses.   No murmur heard. Pulmonary/Chest: Effort normal and breath sounds normal. No respiratory distress. She has no wheezes. She has no rales. She exhibits no tenderness.  Abdominal: Soft. Bowel sounds are normal. She exhibits no distension and no mass. There is no tenderness.  Musculoskeletal: Normal range of motion. She exhibits no edema and no tenderness.  Lymphadenopathy:    She has no cervical adenopathy.  Neurological: She is alert and oriented to person, place, and time. She has normal reflexes. No cranial nerve deficit. Coordination normal.  Skin: Skin is warm and dry. No rash noted. She is not diaphoretic.  Psychiatric: Mood, memory and affect normal.    Lab Results  Component Value Date   TSH 2.02 08/26/2011   Lab Results  Component Value Date   WBC 3.9* 08/26/2011   HGB 12.5 08/26/2011   HCT 37.1 08/26/2011   MCV 86.4 08/26/2011   PLT 172.0 08/26/2011   Lab Results  Component Value Date   CREATININE 0.8 08/26/2011   BUN 20 08/26/2011   NA 138 08/26/2011   K 4.5 08/26/2011   CL 101 08/26/2011   CO2 29 08/26/2011   Lab Results  Component Value Date   ALT 19 08/26/2011   AST 21 08/26/2011   ALKPHOS 63 08/26/2011   BILITOT 0.7 08/26/2011   Lab Results  Component Value Date   CHOL 189 08/26/2011   Lab Results  Component Value Date   HDL 65.00 08/26/2011   Lab Results  Component Value Date   LDLCALC 109* 08/26/2011   Lab Results  Component Value Date   TRIG 74.0 08/26/2011   Lab Results  Component Value Date   CHOLHDL 3 08/26/2011      Assessment & Plan  Dehydration Once again dehydrated, encouraged 64 oz of clear fluids  Preventative health care Fasting labs today,encouraged heart healthy diet, avoid trans fats, get 8 hours of sleep a night. Increase exercise  Genital herpes in women Frequent outbreaks this year. Only taking 1/2 a Valtrex M,W,F. Encouraged to take daily when she has frequent outbreaks  Neck pain, acute encouraged stretching and Aspercreme

## 2012-09-20 ENCOUNTER — Other Ambulatory Visit (INDEPENDENT_AMBULATORY_CARE_PROVIDER_SITE_OTHER): Payer: Commercial Indemnity

## 2012-09-20 DIAGNOSIS — M949 Disorder of cartilage, unspecified: Secondary | ICD-10-CM

## 2012-09-20 DIAGNOSIS — M899 Disorder of bone, unspecified: Secondary | ICD-10-CM

## 2012-09-20 DIAGNOSIS — E785 Hyperlipidemia, unspecified: Secondary | ICD-10-CM

## 2012-09-20 DIAGNOSIS — Z0000 Encounter for general adult medical examination without abnormal findings: Secondary | ICD-10-CM

## 2012-09-20 LAB — TSH: TSH: 1.51 u[IU]/mL (ref 0.35–5.50)

## 2012-09-20 LAB — CBC
HCT: 35 % — ABNORMAL LOW (ref 36.0–46.0)
Hemoglobin: 11.6 g/dL — ABNORMAL LOW (ref 12.0–15.0)
MCHC: 33 g/dL (ref 30.0–36.0)
MCV: 87.7 fl (ref 78.0–100.0)
Platelets: 159 10*3/uL (ref 150.0–400.0)
RBC: 3.99 Mil/uL (ref 3.87–5.11)
RDW: 13.5 % (ref 11.5–14.6)
WBC: 3.4 10*3/uL — ABNORMAL LOW (ref 4.5–10.5)

## 2012-09-20 LAB — HEPATIC FUNCTION PANEL
ALT: 22 U/L (ref 0–35)
AST: 26 U/L (ref 0–37)
Albumin: 4 g/dL (ref 3.5–5.2)
Alkaline Phosphatase: 72 U/L (ref 39–117)
Bilirubin, Direct: 0.1 mg/dL (ref 0.0–0.3)
Total Bilirubin: 0.6 mg/dL (ref 0.3–1.2)
Total Protein: 6.2 g/dL (ref 6.0–8.3)

## 2012-09-20 LAB — LIPID PANEL
Cholesterol: 178 mg/dL (ref 0–200)
HDL: 66.9 mg/dL (ref >39.00)
LDL Cholesterol: 106 mg/dL — ABNORMAL HIGH (ref 0–99)
Total CHOL/HDL Ratio: 3
Triglycerides: 25 mg/dL (ref 0.0–149.0)
VLDL: 5 mg/dL (ref 0.0–40.0)

## 2012-09-20 LAB — RENAL FUNCTION PANEL
Albumin: 4 g/dL (ref 3.5–5.2)
BUN: 17 mg/dL (ref 6–23)
CO2: 29 mEq/L (ref 19–32)
Calcium: 9.3 mg/dL (ref 8.4–10.5)
Chloride: 100 mEq/L (ref 96–112)
Creatinine, Ser: 0.7 mg/dL (ref 0.4–1.2)
GFR: 93.79 mL/min (ref >60.00)
Glucose, Bld: 127 mg/dL — ABNORMAL HIGH (ref 70–99)
Phosphorus: 3.6 mg/dL (ref 2.3–4.6)
Potassium: 4.2 mEq/L (ref 3.5–5.1)
Sodium: 136 mEq/L (ref 135–145)

## 2012-09-20 NOTE — Addendum Note (Signed)
Addended by: Baldemar Lenis R on: 09/20/2012 08:06 AM   Modules accepted: Orders

## 2012-09-20 NOTE — Progress Notes (Signed)
Labs only

## 2012-09-21 LAB — VITAMIN D 25 HYDROXY (VIT D DEFICIENCY, FRACTURES): Vit D, 25-Hydroxy: 44 ng/mL (ref 30–89)

## 2012-10-26 ENCOUNTER — Telehealth: Payer: Self-pay

## 2012-10-26 DIAGNOSIS — M8580 Other specified disorders of bone density and structure, unspecified site: Secondary | ICD-10-CM

## 2012-10-26 NOTE — Telephone Encounter (Signed)
Vitamin D order placed for next year

## 2012-10-26 NOTE — Progress Notes (Signed)
Quick Note:  Patient Informed and voiced understanding ______ 

## 2012-12-23 ENCOUNTER — Other Ambulatory Visit: Payer: Self-pay

## 2013-08-10 ENCOUNTER — Other Ambulatory Visit: Payer: Self-pay | Admitting: Family Medicine

## 2013-08-10 ENCOUNTER — Telehealth: Payer: Self-pay | Admitting: Family Medicine

## 2013-08-10 DIAGNOSIS — Z0000 Encounter for general adult medical examination without abnormal findings: Secondary | ICD-10-CM

## 2013-08-10 NOTE — Telephone Encounter (Signed)
Sure

## 2013-08-10 NOTE — Telephone Encounter (Signed)
Please advise 

## 2013-08-10 NOTE — Telephone Encounter (Signed)
Patient is requesting switch from Dr. Charlett Blake to you and she has scheduled a CPE for 09/06/13, can she come in a week prior for fasting labs?

## 2013-08-11 NOTE — Telephone Encounter (Signed)
LM for pt stating that she was able to come get fasting CPE labs one week prior to her CPE and that she would just need to schedule lab appointment at her convenience.

## 2013-08-22 ENCOUNTER — Other Ambulatory Visit: Payer: Commercial Indemnity

## 2013-08-23 ENCOUNTER — Other Ambulatory Visit (INDEPENDENT_AMBULATORY_CARE_PROVIDER_SITE_OTHER): Payer: Commercial Indemnity

## 2013-08-23 DIAGNOSIS — Z0000 Encounter for general adult medical examination without abnormal findings: Secondary | ICD-10-CM

## 2013-08-23 LAB — CBC WITH DIFFERENTIAL/PLATELET
Basophils Absolute: 0 10*3/uL (ref 0.0–0.1)
Basophils Relative: 0.5 % (ref 0.0–3.0)
Eosinophils Absolute: 0.1 10*3/uL (ref 0.0–0.7)
Eosinophils Relative: 2.1 % (ref 0.0–5.0)
HCT: 37.6 % (ref 36.0–46.0)
Hemoglobin: 12.3 g/dL (ref 12.0–15.0)
Lymphocytes Relative: 33.8 % (ref 12.0–46.0)
Lymphs Abs: 1.3 10*3/uL (ref 0.7–4.0)
MCHC: 32.8 g/dL (ref 30.0–36.0)
MCV: 87.3 fl (ref 78.0–100.0)
Monocytes Absolute: 0.3 10*3/uL (ref 0.1–1.0)
Monocytes Relative: 8.2 % (ref 3.0–12.0)
Neutro Abs: 2.1 10*3/uL (ref 1.4–7.7)
Neutrophils Relative %: 55.4 % (ref 43.0–77.0)
Platelets: 172 10*3/uL (ref 150.0–400.0)
RBC: 4.3 Mil/uL (ref 3.87–5.11)
RDW: 13.4 % (ref 11.5–15.5)
WBC: 3.8 10*3/uL — ABNORMAL LOW (ref 4.0–10.5)

## 2013-08-23 LAB — COMPREHENSIVE METABOLIC PANEL
ALT: 24 U/L (ref 0–35)
AST: 25 U/L (ref 0–37)
Albumin: 4.3 g/dL (ref 3.5–5.2)
Alkaline Phosphatase: 70 U/L (ref 39–117)
BUN: 16 mg/dL (ref 6–23)
CO2: 28 mEq/L (ref 19–32)
Calcium: 9.8 mg/dL (ref 8.4–10.5)
Chloride: 101 mEq/L (ref 96–112)
Creatinine, Ser: 0.7 mg/dL (ref 0.4–1.2)
GFR: 86.15 mL/min (ref >60.00)
Glucose, Bld: 97 mg/dL (ref 70–99)
Potassium: 5.1 mEq/L (ref 3.5–5.1)
Sodium: 136 mEq/L (ref 135–145)
Total Bilirubin: 0.6 mg/dL (ref 0.2–1.2)
Total Protein: 6.6 g/dL (ref 6.0–8.3)

## 2013-08-24 LAB — TSH: TSH: 1.51 u[IU]/mL (ref 0.35–4.50)

## 2013-09-02 ENCOUNTER — Other Ambulatory Visit (INDEPENDENT_AMBULATORY_CARE_PROVIDER_SITE_OTHER): Payer: Commercial Indemnity

## 2013-09-02 DIAGNOSIS — Z0000 Encounter for general adult medical examination without abnormal findings: Secondary | ICD-10-CM

## 2013-09-02 LAB — LIPID PANEL
Cholesterol: 171 mg/dL (ref 0–200)
HDL: 64 mg/dL (ref >39.00)
LDL Cholesterol: 101 mg/dL — ABNORMAL HIGH (ref 0–99)
NonHDL: 107
Total CHOL/HDL Ratio: 3
Triglycerides: 32 mg/dL (ref 0.0–149.0)
VLDL: 6.4 mg/dL (ref 0.0–40.0)

## 2013-09-06 ENCOUNTER — Encounter: Payer: Self-pay | Admitting: Family Medicine

## 2013-09-06 ENCOUNTER — Ambulatory Visit (INDEPENDENT_AMBULATORY_CARE_PROVIDER_SITE_OTHER): Payer: Commercial Indemnity | Admitting: Family Medicine

## 2013-09-06 VITALS — BP 112/71 | HR 71 | Temp 98.2°F | Resp 18 | Ht 64.25 in | Wt 118.0 lb

## 2013-09-06 DIAGNOSIS — Z0000 Encounter for general adult medical examination without abnormal findings: Secondary | ICD-10-CM

## 2013-09-06 NOTE — Assessment & Plan Note (Signed)
Reviewed age and gender appropriate health maintenance issues (prudent diet, regular exercise, health risks of tobacco and excessive alcohol, use of seatbelts, fire alarms in home, use of sunscreen).  Also reviewed age and gender appropriate health screening as well as vaccine recommendations. Discussed IFG dx'd 2014.  Labs this year better. Will see her again in 6 mo and check BMET the week prior to her visit (fasting). She is already doing well with exercise and diabetic type diet. GYN--UTD on screening, sees GYN annually.

## 2013-09-06 NOTE — Progress Notes (Signed)
Office Note 09/06/2013  CC:  Chief Complaint  Patient presents with  . Annual Exam    transfer from Bonnie [name]   HPI:  Bonnie [name] is a 63 y.o. White female who is here to establish care, due for annual exam. No acute complaints. Has been eating low carb diet since having IFG last year.   Past Medical History  Diagnosis Date  . Genital herpes in women 02-17-09    she "plays with" the dose: 1/2-1 tab per day  . Lactose intolerance     ?  . Multiple allergies 08/02/2010  . Neutropenia 08/02/2010  . Neck pain, acute 12/11/2010  . Polyuria 08/26/2011  . Knee pain, right 08/26/2011  . Impaired fasting glucose 2014    Saw nutritionist    Past Surgical History  Procedure Laterality Date  . Cataract surgery  4-12 and 5-12  . Broken leg  6 months old  . Teeth pulled  63 yrs old  . Cosmetic surgery  63 yrs old    nose  . Colonoscopy  2009    recall 10 yrs    Family History  Problem Relation Age of Onset  . Heart disease Father     passed after by pass surgery  . Hypertension Father   . Hyperlipidemia Father   . Other Sister     auto immune issues  . Gout Sister   . Autoimmune disease Sister     fatigue, rashes  . Osteopenia Sister   . Osteoporosis Mother     History   Social History  . Marital Status: Married    Spouse Name: N/A    Number of Children: N/A  . Years of Education: N/A   Occupational History  . Not on file.   Social History Main Topics  . Smoking status: Never Smoker   . Smokeless tobacco: Never Used  . Alcohol Use: No     Comment: 1-2 beers a week or 1-2 glasses of wine a week  . Drug Use: No  . Sexual Activity: Yes    Partners: Male   Other Topics Concern  . Not on file   Social History Narrative   Married, 2 grown children.   Lives in [place], [place] from [place].   Occupation: Optician, dispensing   No tob, 1-2 beer/wine per week.  No hx of drug abuse or alcohol probs.   Enjoys outdoors, snow skiing, swimming, running, hiking.     Outpatient Prescriptions Prior to Visit  Medication Sig Dispense Refill  . aspirin EC 81 MG tablet Take 1 tablet (81 mg total) by mouth daily.      . Calcium Carbonate-Vitamin D (CALCIUM-VITAMIN D) 500-200 MG-UNIT per tablet Take 1 tablet by mouth daily.        Marland Kitchen KRILL OIL PO Take 500 mg by mouth daily. Mega Red      . multivitamin (THERAGRAN) tablet Take 1 tablet by mouth daily. One a day      . metroNIDAZOLE (FLAGYL) 500 MG tablet Take 1 tablet (500 mg total) by mouth 2 (two) times daily.  10 tablet  0  . valACYclovir (VALTREX) 500 MG tablet Take 400 mg by mouth as directed. 3-4 times per week       No facility-administered medications prior to visit.    Allergies  Allergen Reactions  . Cyclorex [Cyclandelate]     ROS Review of Systems  Constitutional: Negative for fever, chills, appetite change and fatigue.  HENT: Negative for congestion, dental problem, ear pain  and sore throat.   Eyes: Negative for discharge, redness and visual disturbance.  Respiratory: Negative for cough, chest tightness, shortness of breath and wheezing.   Cardiovascular: Negative for chest pain, palpitations and leg swelling.  Gastrointestinal: Negative for nausea, vomiting, abdominal pain, diarrhea and blood in stool.  Genitourinary: Negative for dysuria, urgency, frequency, hematuria, flank pain and difficulty urinating.  Musculoskeletal: Negative for arthralgias, back pain, joint swelling, myalgias and neck stiffness.  Skin: Negative for pallor and rash.  Neurological: Negative for dizziness, speech difficulty, weakness and headaches.  Hematological: Negative for adenopathy. Does not bruise/bleed easily.  Psychiatric/Behavioral: Negative for confusion and sleep disturbance. The patient is not nervous/anxious.     PE; Blood pressure 112/71, pulse 71, temperature 98.2 F (36.8 C), temperature source Oral, resp. rate 18, height 5' 4.25" (1.632 m), weight 118 lb (53.524 kg), SpO2 100.00%. Exam  chaperoned by CMA Jacklynn Ganong. Gen: Alert, well appearing.  Patient is oriented to person, place, time, and situation. AFFECT: pleasant, lucid thought and speech. ENT: Ears: EACs clear, normal epithelium.  TMs with good light reflex and landmarks bilaterally.  Eyes: no injection, icteris, swelling, or exudate.  EOMI, PERRLA. Nose: no drainage or turbinate edema/swelling.  No injection or focal lesion.  Mouth: lips without lesion/swelling.  Oral mucosa pink and moist.  Dentition intact and without obvious caries or gingival swelling.  Oropharynx without erythema, exudate, or swelling.  Neck: supple/nontender.  No LAD, mass, or TM.  Carotid pulses 2+ bilaterally, without bruits. CV: RRR, no m/r/g.   LUNGS: CTA bilat, nonlabored resps, good aeration in all lung fields. ABD: soft, NT, ND, BS normal.  No hepatospenomegaly or mass.  No bruits. EXT: no clubbing, cyanosis, or edema.  Musculoskeletal: no joint swelling, erythema, warmth, or tenderness.  ROM of all joints intact. Skin - no sores or suspicious lesions or rashes or color changes   Pertinent labs:  Lab Results  Component Value Date   WBC 3.8* 08/23/2013   HGB 12.3 08/23/2013   HCT 37.6 08/23/2013   MCV 87.3 08/23/2013   PLT 172.0 08/23/2013     Chemistry      Component Value Date/Time   NA 136 08/23/2013 0816   K 5.1 08/23/2013 0816   CL 101 08/23/2013 0816   CO2 28 08/23/2013 0816   BUN 16 08/23/2013 0816   CREATININE 0.7 08/23/2013 0816      Component Value Date/Time   CALCIUM 9.8 08/23/2013 0816   ALKPHOS 70 08/23/2013 0816   AST 25 08/23/2013 0816   ALT 24 08/23/2013 0816   BILITOT 0.6 08/23/2013 0816     Lab Results  Component Value Date   CHOL 171 09/02/2013   HDL 64.00 09/02/2013   LDLCALC 101* 09/02/2013   TRIG 32.0 09/02/2013   CHOLHDL 3 09/02/2013   Lab Results  Component Value Date   TSH 1.51 08/23/2013     ASSESSMENT AND PLAN:   Health maintenance examination Reviewed age and gender appropriate health maintenance issues (prudent  diet, regular exercise, health risks of tobacco and excessive alcohol, use of seatbelts, fire alarms in home, use of sunscreen).  Also reviewed age and gender appropriate health screening as well as vaccine recommendations. Discussed IFG dx'd 2014.  Labs this year better. Will see her again in 6 mo and check BMET the week prior to her visit (fasting). She is already doing well with exercise and diabetic type diet. GYN--UTD on screening, sees GYN annually.   An After Visit Summary was printed and given  to the patient.  FOLLOW UP:  Return in about 6 months (around 03/09/2014) for o/v to f/u IFG; needs lab visit the week prior for fasting BMET.

## 2013-09-06 NOTE — Progress Notes (Signed)
Pre visit review using our clinic review tool, if applicable. No additional management support is needed unless otherwise documented below in the visit note. 

## 2014-02-28 ENCOUNTER — Telehealth: Payer: Self-pay

## 2014-02-28 ENCOUNTER — Other Ambulatory Visit (INDEPENDENT_AMBULATORY_CARE_PROVIDER_SITE_OTHER): Payer: Commercial Indemnity

## 2014-02-28 DIAGNOSIS — R7301 Impaired fasting glucose: Secondary | ICD-10-CM

## 2014-02-28 LAB — BASIC METABOLIC PANEL
BUN: 17 mg/dL (ref 6–23)
CO2: 29 mEq/L (ref 19–32)
Calcium: 9.8 mg/dL (ref 8.4–10.5)
Chloride: 101 mEq/L (ref 96–112)
Creatinine, Ser: 0.7 mg/dL (ref 0.4–1.2)
GFR: 90.27 mL/min (ref >60.00)
Glucose, Bld: 92 mg/dL (ref 70–99)
Potassium: 5.6 mEq/L — ABNORMAL HIGH (ref 3.5–5.1)
Sodium: 136 mEq/L (ref 135–145)

## 2014-02-28 NOTE — Telephone Encounter (Signed)
Pt came in for blood draw and a BMET needs to be ordered. What diagnosis code do you want to use for this test? Please advise.

## 2014-03-09 ENCOUNTER — Ambulatory Visit (INDEPENDENT_AMBULATORY_CARE_PROVIDER_SITE_OTHER): Payer: Commercial Indemnity | Admitting: Family Medicine

## 2014-03-09 ENCOUNTER — Encounter: Payer: Self-pay | Admitting: Family Medicine

## 2014-03-09 VITALS — BP 113/76 | HR 74 | Temp 98.6°F | Resp 18 | Ht 64.25 in | Wt 120.0 lb

## 2014-03-09 DIAGNOSIS — R7301 Impaired fasting glucose: Secondary | ICD-10-CM

## 2014-03-09 DIAGNOSIS — E875 Hyperkalemia: Secondary | ICD-10-CM

## 2014-03-09 LAB — BASIC METABOLIC PANEL
BUN: 18 mg/dL (ref 6–23)
CO2: 31 mEq/L (ref 19–32)
Calcium: 9.6 mg/dL (ref 8.4–10.5)
Chloride: 102 mEq/L (ref 96–112)
Creatinine, Ser: 0.86 mg/dL (ref 0.40–1.20)
GFR: 71.18 mL/min (ref >60.00)
Glucose, Bld: 112 mg/dL — ABNORMAL HIGH (ref 70–99)
Potassium: 4.3 mEq/L (ref 3.5–5.1)
Sodium: 138 mEq/L (ref 135–145)

## 2014-03-09 LAB — HEMOGLOBIN A1C: Hgb A1c MFr Bld: 6.1 % (ref 4.6–6.5)

## 2014-03-09 NOTE — Progress Notes (Signed)
OFFICE NOTE  03/09/2014  CC:  Chief Complaint  Patient presents with  . Follow-up     HPI: Patient is a 64 y.o. Caucasian female who is here for 6 mo f/u impaired fasting glucose. Recent repeat of her BMET showed gluc 92 but K was 5.6.  She takes a MVI qd but no other potassium supplement. We'll recheck this today--possible lab error. Will add A1c to testing today.  Pt doing well with diabetic diet.  No complaints or questions.    Pertinent PMH:  Past medical, surgical, social, and family history reviewed and no changes are noted since last office visit.  MEDS:  Outpatient Prescriptions Prior to Visit  Medication Sig Dispense Refill  . acyclovir (ZOVIRAX) 400 MG tablet Take 400 mg by mouth 2 (two) times daily.    Marland Kitchen aspirin EC 81 MG tablet Take 1 tablet (81 mg total) by mouth daily.    . Calcium Carbonate-Vitamin D (CALCIUM-VITAMIN D) 500-200 MG-UNIT per tablet Take 1 tablet by mouth daily.      Marland Kitchen KRILL OIL PO Take 500 mg by mouth daily. Mega Red    . multivitamin (THERAGRAN) tablet Take 1 tablet by mouth daily. One a day    . metroNIDAZOLE (FLAGYL) 500 MG tablet Take 1 tablet (500 mg total) by mouth 2 (two) times daily. 10 tablet 0  . valACYclovir (VALTREX) 500 MG tablet Take 400 mg by mouth as directed. 3-4 times per week     No facility-administered medications prior to visit.    PE: Blood pressure 113/76, pulse 74, temperature 98.6 F (37 C), temperature source Temporal, resp. rate 18, height 5' 4.25" (1.632 m), weight 120 lb (54.432 kg), SpO2 100 %. Gen: Alert, well appearing.  Patient is oriented to person, place, time, and situation. No further exam today.  LABS: none today RECENT: Lab Results  Component Value Date   HGBA1C 6.0 08/26/2011     Chemistry      Component Value Date/Time   NA 136 02/28/2014 1040   K 5.6* 02/28/2014 1040   CL 101 02/28/2014 1040   CO2 29 02/28/2014 1040   BUN 17 02/28/2014 1040   CREATININE 0.7 02/28/2014 1040      Component  Value Date/Time   CALCIUM 9.8 02/28/2014 1040   ALKPHOS 70 08/23/2013 0816   AST 25 08/23/2013 0816   ALT 24 08/23/2013 0816   BILITOT 0.6 08/23/2013 0816      IMPRESSION AND PLAN:  1) Impaired fasting glucose: normal fasting gluc at recent recheck.  Will repeat HbA1c since this was last done 2013 and was 6.0%.  2) Hyperkalemia: ?lab error.  Repeat today.  Pt avoiding pot supplements.  An After Visit Summary was printed and given to the patient.  FOLLOW UP: 6 mo CPE

## 2014-03-09 NOTE — Progress Notes (Signed)
Pre visit review using our clinic review tool, if applicable. No additional management support is needed unless otherwise documented below in the visit note. 

## 2014-08-28 ENCOUNTER — Other Ambulatory Visit: Payer: Commercial Indemnity

## 2014-08-31 ENCOUNTER — Other Ambulatory Visit (INDEPENDENT_AMBULATORY_CARE_PROVIDER_SITE_OTHER): Payer: Commercial Indemnity

## 2014-08-31 DIAGNOSIS — Z0000 Encounter for general adult medical examination without abnormal findings: Secondary | ICD-10-CM

## 2014-08-31 LAB — COMPREHENSIVE METABOLIC PANEL
ALT: 17 U/L (ref 0–35)
AST: 20 U/L (ref 0–37)
Albumin: 4.2 g/dL (ref 3.5–5.2)
Alkaline Phosphatase: 73 U/L (ref 39–117)
BUN: 15 mg/dL (ref 6–23)
CO2: 32 mEq/L (ref 19–32)
Calcium: 9.9 mg/dL (ref 8.4–10.5)
Chloride: 104 mEq/L (ref 96–112)
Creatinine, Ser: 0.8 mg/dL (ref 0.40–1.20)
GFR: 77.25 mL/min (ref >60.00)
Glucose, Bld: 88 mg/dL (ref 70–99)
Potassium: 5.2 mEq/L — ABNORMAL HIGH (ref 3.5–5.1)
Sodium: 141 mEq/L (ref 135–145)
Total Bilirubin: 0.6 mg/dL (ref 0.2–1.2)
Total Protein: 6.7 g/dL (ref 6.0–8.3)

## 2014-08-31 LAB — CBC WITH DIFFERENTIAL/PLATELET
Basophils Absolute: 0.1 10*3/uL (ref 0.0–0.1)
Basophils Relative: 2.1 % (ref 0.0–3.0)
Eosinophils Absolute: 0.1 10*3/uL (ref 0.0–0.7)
Eosinophils Relative: 2.1 % (ref 0.0–5.0)
HCT: 38.1 % (ref 36.0–46.0)
Hemoglobin: 12.7 g/dL (ref 12.0–15.0)
Lymphocytes Relative: 35.7 % (ref 12.0–46.0)
Lymphs Abs: 1.4 10*3/uL (ref 0.7–4.0)
MCHC: 33.4 g/dL (ref 30.0–36.0)
MCV: 85.3 fl (ref 78.0–100.0)
Monocytes Absolute: 0.3 10*3/uL (ref 0.1–1.0)
Monocytes Relative: 8.6 % (ref 3.0–12.0)
Neutro Abs: 2 10*3/uL (ref 1.4–7.7)
Neutrophils Relative %: 51.5 % (ref 43.0–77.0)
Platelets: 177 10*3/uL (ref 150.0–400.0)
RBC: 4.46 Mil/uL (ref 3.87–5.11)
RDW: 13.7 % (ref 11.5–15.5)
WBC: 3.8 10*3/uL — ABNORMAL LOW (ref 4.0–10.5)

## 2014-08-31 LAB — LIPID PANEL
Cholesterol: 177 mg/dL (ref 0–200)
HDL: 69.9 mg/dL (ref >39.00)
LDL Cholesterol: 97 mg/dL (ref 0–99)
NonHDL: 107.1
Total CHOL/HDL Ratio: 3
Triglycerides: 52 mg/dL (ref 0.0–149.0)
VLDL: 10.4 mg/dL (ref 0.0–40.0)

## 2014-08-31 LAB — TSH: TSH: 1.69 u[IU]/mL (ref 0.35–4.50)

## 2014-09-07 ENCOUNTER — Encounter: Payer: Self-pay | Admitting: Family Medicine

## 2014-09-07 ENCOUNTER — Ambulatory Visit (INDEPENDENT_AMBULATORY_CARE_PROVIDER_SITE_OTHER): Payer: Commercial Indemnity | Admitting: Family Medicine

## 2014-09-07 VITALS — BP 104/70 | HR 73 | Temp 98.0°F | Resp 16 | Ht 64.25 in | Wt 120.0 lb

## 2014-09-07 DIAGNOSIS — Z0000 Encounter for general adult medical examination without abnormal findings: Secondary | ICD-10-CM | POA: Diagnosis not present

## 2014-09-07 NOTE — Progress Notes (Signed)
Pre visit review using our clinic review tool, if applicable. No additional management support is needed unless otherwise documented below in the visit note. 

## 2014-09-07 NOTE — Progress Notes (Signed)
Office Note 09/07/2014  CC:  Chief Complaint  Patient presents with  . Annual Exam    Pt is not fasting, labs done last week.    HPI:  Bonnie [name] is a 64 y.o. White female who is here to get annual health maintenance exam. She eats a diabetic diet for hx of IFG.  We reviewed all recent fasting labs in detail today (see below). Has appt with her GYN 10/30/14 for cerv ca and breast ca screening.  Has had some PND cough on/off for last couple months.  No wheezing/chest tightness/SOB.  No fevers or chest pains or hemoptysis.   Past Medical History  Diagnosis Date  . Genital herpes in women 02-17-09    she "plays with" the dose: 1/2-1 tab per day  . Lactose intolerance     ?  . Multiple allergies 08/02/2010  . Neutropenia 08/02/2010  . Neck pain, acute 12/11/2010  . Polyuria 08/26/2011  . Knee pain, right 08/26/2011  . Impaired fasting glucose 2014    Saw nutritionist (HbA1c 6.1% 02/2014)    Past Surgical History  Procedure Laterality Date  . Cataract surgery  4-12 and 5-12  . Broken leg  6 months old  . Teeth pulled  64 yrs old  . Cosmetic surgery  64 yrs old    nose  . Colonoscopy  2009    recall 10 yrs    Family History  Problem Relation Age of Onset  . Heart disease Father     passed after by pass surgery  . Hypertension Father   . Hyperlipidemia Father   . Other Sister     auto immune issues  . Gout Sister   . Autoimmune disease Sister     fatigue, rashes  . Osteopenia Sister   . Osteoporosis Mother     History   Social History  . Marital Status: Married    Spouse Name: N/A  . Number of Children: N/A  . Years of Education: N/A   Occupational History  . Not on file.   Social History Main Topics  . Smoking status: Never Smoker   . Smokeless tobacco: Never Used  . Alcohol Use: No     Comment: 1-2 beers a week or 1-2 glasses of wine a week  . Drug Use: No  . Sexual Activity:    Partners: Male   Other Topics Concern  . Not on file   Social  History Narrative   Married, 2 grown children.   Lives in [place], [place] from [place].   Occupation: Optician, dispensing   No tob, 1-2 beer/wine per week.  No hx of drug abuse or alcohol probs.   Enjoys outdoors, snow skiing, swimming, running, hiking.    Outpatient Prescriptions Prior to Visit  Medication Sig Dispense Refill  . acyclovir (ZOVIRAX) 400 MG tablet Take 400 mg by mouth 2 (two) times daily.    . Calcium Carbonate-Vitamin D (CALCIUM-VITAMIN D) 500-200 MG-UNIT per tablet Take 1 tablet by mouth daily.      Marland Kitchen KRILL OIL PO Take 500 mg by mouth daily. Mega Red    . multivitamin (THERAGRAN) tablet Take 1 tablet by mouth daily. One a day    . aspirin EC 81 MG tablet Take 1 tablet (81 mg total) by mouth daily.     No facility-administered medications prior to visit.    Allergies  Allergen Reactions  . Cyclorex [Cyclandelate]     ROS Review of Systems  Constitutional: Negative for fever, chills,  appetite change and fatigue.  HENT: Negative for congestion, dental problem, ear pain and sore throat.   Eyes: Negative for discharge, redness and visual disturbance.  Respiratory: Negative for cough, chest tightness, shortness of breath and wheezing.   Cardiovascular: Negative for chest pain, palpitations and leg swelling.  Gastrointestinal: Negative for nausea, vomiting, abdominal pain, diarrhea and blood in stool.  Endocrine: Negative for polydipsia, polyphagia and polyuria.  Genitourinary: Negative for dysuria, urgency, frequency, hematuria, flank pain and difficulty urinating.  Musculoskeletal: Negative for myalgias, back pain, joint swelling, arthralgias and neck stiffness.  Skin: Negative for pallor and rash.  Neurological: Negative for dizziness, speech difficulty, weakness and headaches.  Hematological: Negative for adenopathy. Does not bruise/bleed easily.  Psychiatric/Behavioral: Negative for confusion and sleep disturbance. The patient is not nervous/anxious.    PE; Blood  pressure 104/70, pulse 73, temperature 98 F (36.7 C), temperature source Oral, resp. rate 16, height 5' 4.25" (1.632 m), weight 120 lb (54.432 kg), SpO2 98 %. Gen: Alert, well appearing.  Patient is oriented to person, place, time, and situation. AFFECT: pleasant, lucid thought and speech. ENT: Ears: EACs clear, normal epithelium.  TMs with good light reflex and landmarks bilaterally.  Eyes: no injection, icteris, swelling, or exudate.  EOMI, PERRLA. Nose: no drainage or turbinate edema/swelling.  No injection or focal lesion.  Mouth: lips without lesion/swelling.  Oral mucosa pink and moist.  Dentition intact and without obvious caries or gingival swelling.  Oropharynx without erythema, exudate, or swelling.  Neck: supple/nontender.  No LAD, mass, or TM.  Carotid pulses 2+ bilaterally, without bruits. CV: RRR, no m/r/g.   LUNGS: CTA bilat, nonlabored resps, good aeration in all lung fields. ABD: soft, NT, ND, BS normal.  No hepatospenomegaly or mass.  No bruits. EXT: no clubbing, cyanosis, or edema.  Musculoskeletal: no joint swelling, erythema, warmth, or tenderness.  ROM of all joints intact. Skin - no sores or suspicious lesions or rashes or color changes   Pertinent labs:  Lab Results  Component Value Date   TSH 1.69 08/31/2014   Lab Results  Component Value Date   WBC 3.8* 08/31/2014   HGB 12.7 08/31/2014   HCT 38.1 08/31/2014   MCV 85.3 08/31/2014   PLT 177.0 08/31/2014   Lab Results  Component Value Date   CREATININE 0.80 08/31/2014   BUN 15 08/31/2014   NA 141 08/31/2014   K 5.2* 08/31/2014   CL 104 08/31/2014   CO2 32 08/31/2014  Fasting glucose  88           On  08/31/14 Lab Results  Component Value Date   ALT 17 08/31/2014   AST 20 08/31/2014   ALKPHOS 73 08/31/2014   BILITOT 0.6 08/31/2014   Lab Results  Component Value Date   CHOL 177 08/31/2014   Lab Results  Component Value Date   HDL 69.90 08/31/2014   Lab Results  Component Value Date   LDLCALC  97 08/31/2014   Lab Results  Component Value Date   TRIG 52.0 08/31/2014   Lab Results  Component Value Date   CHOLHDL 3 08/31/2014   ASSESSMENT AND PLAN:   Health maintenance exam: Reviewed age and gender appropriate health maintenance issues (prudent diet, regular exercise, health risks of tobacco and excessive alcohol, use of seatbelts, fire alarms in home, use of sunscreen).  Also reviewed age and gender appropriate health screening as well as vaccine recommendations. Vaccines all UTD. All HP labs good. She is ok to d/c ASA at this  time: it is not indicated in this pt for primary prevention. She had DEXA 2014: T score -1.7.  Plan on repeating DEXA in 2-3 years.  Continue calcium and vit D supplement.  For recurrent PND cough, I recommended trial of OTC nonsedating antihistamine prn.  FOLLOW UP:  Return in about 6 months (around 03/10/2015) for f/u insulin resistance/recheck A1c.

## 2015-02-18 HISTORY — PX: OTHER SURGICAL HISTORY: SHX169

## 2015-02-28 ENCOUNTER — Other Ambulatory Visit (INDEPENDENT_AMBULATORY_CARE_PROVIDER_SITE_OTHER): Payer: Commercial Indemnity

## 2015-02-28 DIAGNOSIS — E8881 Metabolic syndrome: Secondary | ICD-10-CM

## 2015-02-28 LAB — HEMOGLOBIN A1C: Hgb A1c MFr Bld: 5.9 % (ref 4.6–6.5)

## 2015-03-08 ENCOUNTER — Encounter: Payer: Self-pay | Admitting: Family Medicine

## 2015-03-08 ENCOUNTER — Ambulatory Visit (INDEPENDENT_AMBULATORY_CARE_PROVIDER_SITE_OTHER): Payer: Managed Care, Other (non HMO) | Admitting: Family Medicine

## 2015-03-08 VITALS — BP 106/64 | HR 75 | Temp 97.7°F | Resp 16 | Ht 64.25 in | Wt 125.5 lb

## 2015-03-08 DIAGNOSIS — R7301 Impaired fasting glucose: Secondary | ICD-10-CM | POA: Diagnosis not present

## 2015-03-08 NOTE — Progress Notes (Signed)
OFFICE VISIT  03/08/2015   CC:  Chief Complaint  Patient presents with  . Follow-up    Insulin Resistance, recheck HgA1c. Pt is not fasting.   HPI:    Patient is a 65 y.o. Caucasian female who presents for 6 mo f/u IFG/Insulin resistance. We checked her a1c again 8d/a and it was 5.9%.  +Diabetic diet. Does zumba, some light workouts at G[employer], runs pretty regularly.  Still with frequent cough assoc with PND.  Dry cough.  Little scratchy cough. Denies feeling GER sx's consistently.  Seems to come and go, no clear pattern.  No feeling.     Past Medical History  Diagnosis Date  . Genital herpes in women 02-17-09    she "plays with" the dose: 1/2-1 tab per day  . Lactose intolerance     ?  . Multiple allergies 08/02/2010  . Neutropenia 08/02/2010  . Neck pain, acute 12/11/2010  . Polyuria 08/26/2011  . Knee pain, right 08/26/2011  . Impaired fasting glucose 2014    Saw nutritionist (HbA1c 6.1% 02/2014)    Past Surgical History  Procedure Laterality Date  . Cataract surgery  4-12 and 5-12  . Broken leg  6 months old  . Teeth pulled  65 yrs old  . Cosmetic surgery  66 yrs old    nose  . Colonoscopy  2009    recall 10 yrs    Outpatient Prescriptions Prior to Visit  Medication Sig Dispense Refill  . acyclovir (ZOVIRAX) 400 MG tablet Take 400 mg by mouth 2 (two) times daily.    . Calcium Carbonate-Vitamin D (CALCIUM-VITAMIN D) 500-200 MG-UNIT per tablet Take 1 tablet by mouth daily.      Marland Kitchen KRILL OIL PO Take 500 mg by mouth daily. Mega Red    . multivitamin (THERAGRAN) tablet Take 1 tablet by mouth daily. One a day     No facility-administered medications prior to visit.    Allergies  Allergen Reactions  . Cyclorex [Cyclandelate]     ROS As per HPI  PE: Blood pressure 106/64, pulse 75, temperature 97.7 F (36.5 C), temperature source Oral, resp. rate 16, height 5' 4.25" (1.632 m), weight 125 lb 8 oz (56.926 kg), SpO2 100 %. Gen: Alert, well appearing.  Patient is oriented  to person, place, time, and situation. [id number]: no injection, icteris, swelling, or exudate.  EOMI, PERRLA. Mouth: lips without lesion/swelling.  Oral mucosa pink and moist. Oropharynx without erythema, exudate, or swelling.  CV: RRR, no m/r/g.   LUNGS: CTA bilat, nonlabored resps, good aeration in all lung fields.   LABS:  Lab Results  Component Value Date   HGBA1C 5.9 02/28/2015     Chemistry      Component Value Date/Time   NA 141 08/31/2014 0812   K 5.2* 08/31/2014 0812   CL 104 08/31/2014 0812   CO2 32 08/31/2014 0812   BUN 15 08/31/2014 0812   CREATININE 0.80 08/31/2014 0812      Component Value Date/Time   CALCIUM 9.9 08/31/2014 0812   ALKPHOS 73 08/31/2014 0812   AST 20 08/31/2014 0812   ALT 17 08/31/2014 0812   BILITOT 0.6 08/31/2014 0812     Lab Results  Component Value Date   TSH 1.69 08/31/2014   IMPRESSION AND PLAN:  1) IFG/Insulin resistance: doing well with TLC, A1c down a little.    2) Cough secondary to upper airway irritation: try OTC generic allegra 180 mg once a day.  An After Visit Summary was printed  and given to the patient.  FOLLOW UP: Return in about 6 months (around 09/05/2015) for annual CPE (fasting).

## 2015-03-08 NOTE — Patient Instructions (Signed)
Try OTC allegra 180 mg once daily to see if this helps with your cough.

## 2015-03-08 NOTE — Progress Notes (Signed)
Pre visit review using our clinic review tool, if applicable. No additional management support is needed unless otherwise documented below in the visit note. 

## 2015-03-27 ENCOUNTER — Telehealth: Payer: Self-pay | Admitting: *Deleted

## 2015-03-27 DIAGNOSIS — R059 Cough, unspecified: Secondary | ICD-10-CM

## 2015-03-27 DIAGNOSIS — R05 Cough: Secondary | ICD-10-CM

## 2015-03-27 NOTE — Telephone Encounter (Signed)
Pt LMOM on 03/27/15 at 2:55pm stating that she has tried the allegra like Dr. Anitra Lauth recommended for her cough but it has not helped. She wants to know if there is something else she can do. Please advise. Thanks.

## 2015-03-28 NOTE — Telephone Encounter (Signed)
Ask pt if she would be interested in seeing an ear, nose, and throat specialist for this.

## 2015-03-28 NOTE — Telephone Encounter (Signed)
Left message for pt to call back  °

## 2015-03-28 NOTE — Telephone Encounter (Signed)
ENT referral ordered ([facility]).

## 2015-03-28 NOTE — Telephone Encounter (Signed)
Pt advised and voiced understanding.   

## 2015-03-28 NOTE — Telephone Encounter (Signed)
Spoke to pt and she stated that she is okay with seeing ENT.

## 2015-04-09 ENCOUNTER — Encounter: Payer: Self-pay | Admitting: Family Medicine

## 2015-04-09 DIAGNOSIS — J387 Other diseases of larynx: Secondary | ICD-10-CM

## 2015-04-09 HISTORY — DX: Other diseases of larynx: J38.7

## 2015-08-30 ENCOUNTER — Other Ambulatory Visit (INDEPENDENT_AMBULATORY_CARE_PROVIDER_SITE_OTHER): Payer: Managed Care, Other (non HMO)

## 2015-08-30 DIAGNOSIS — R7301 Impaired fasting glucose: Secondary | ICD-10-CM

## 2015-08-30 DIAGNOSIS — Z0000 Encounter for general adult medical examination without abnormal findings: Secondary | ICD-10-CM | POA: Diagnosis not present

## 2015-08-30 LAB — CBC WITH DIFFERENTIAL/PLATELET
Basophils Absolute: 0 10*3/uL (ref 0.0–0.1)
Basophils Relative: 1.1 % (ref 0.0–3.0)
Eosinophils Absolute: 0.1 10*3/uL (ref 0.0–0.7)
Eosinophils Relative: 1.8 % (ref 0.0–5.0)
HCT: 36.7 % (ref 36.0–46.0)
Hemoglobin: 12.2 g/dL (ref 12.0–15.0)
Lymphocytes Relative: 34.8 % (ref 12.0–46.0)
Lymphs Abs: 1.3 10*3/uL (ref 0.7–4.0)
MCHC: 33.2 g/dL (ref 30.0–36.0)
MCV: 84.7 fl (ref 78.0–100.0)
Monocytes Absolute: 0.3 10*3/uL (ref 0.1–1.0)
Monocytes Relative: 8 % (ref 3.0–12.0)
Neutro Abs: 2 10*3/uL (ref 1.4–7.7)
Neutrophils Relative %: 54.3 % (ref 43.0–77.0)
Platelets: 183 10*3/uL (ref 150.0–400.0)
RBC: 4.34 Mil/uL (ref 3.87–5.11)
RDW: 13.5 % (ref 11.5–15.5)
WBC: 3.8 10*3/uL — ABNORMAL LOW (ref 4.0–10.5)

## 2015-08-30 LAB — HEMOGLOBIN A1C: Hgb A1c MFr Bld: 5.7 % (ref 4.6–6.5)

## 2015-08-30 LAB — TSH: TSH: 1.77 u[IU]/mL (ref 0.35–4.50)

## 2015-08-30 NOTE — Addendum Note (Signed)
Addended by: Ralph Dowdy on: 08/30/2015 10:15 AM   Modules accepted: Orders

## 2015-08-31 LAB — LIPID PANEL
Cholesterol: 177 mg/dL (ref 0–200)
HDL: 65.1 mg/dL (ref >39.00)
LDL Cholesterol: 101 mg/dL — ABNORMAL HIGH (ref 0–99)
NonHDL: 112.06
Total CHOL/HDL Ratio: 3
Triglycerides: 54 mg/dL (ref 0.0–149.0)
VLDL: 10.8 mg/dL (ref 0.0–40.0)

## 2015-08-31 LAB — COMPREHENSIVE METABOLIC PANEL
ALT: 15 U/L (ref 0–35)
AST: 18 U/L (ref 0–37)
Albumin: 4.1 g/dL (ref 3.5–5.2)
Alkaline Phosphatase: 67 U/L (ref 39–117)
BUN: 14 mg/dL (ref 6–23)
CO2: 28 mEq/L (ref 19–32)
Calcium: 9.7 mg/dL (ref 8.4–10.5)
Chloride: 102 mEq/L (ref 96–112)
Creatinine, Ser: 0.76 mg/dL (ref 0.40–1.20)
GFR: 81.69 mL/min (ref >60.00)
Glucose, Bld: 91 mg/dL (ref 70–99)
Potassium: 4.7 mEq/L (ref 3.5–5.1)
Sodium: 138 mEq/L (ref 135–145)
Total Bilirubin: 0.5 mg/dL (ref 0.2–1.2)
Total Protein: 6.5 g/dL (ref 6.0–8.3)

## 2015-09-05 ENCOUNTER — Encounter: Payer: Self-pay | Admitting: Family Medicine

## 2015-09-06 ENCOUNTER — Ambulatory Visit (INDEPENDENT_AMBULATORY_CARE_PROVIDER_SITE_OTHER): Payer: Managed Care, Other (non HMO) | Admitting: Family Medicine

## 2015-09-06 ENCOUNTER — Encounter: Payer: Self-pay | Admitting: Family Medicine

## 2015-09-06 VITALS — BP 127/78 | HR 69 | Temp 97.8°F | Resp 16 | Ht 64.75 in | Wt 126.2 lb

## 2015-09-06 DIAGNOSIS — Z0000 Encounter for general adult medical examination without abnormal findings: Secondary | ICD-10-CM | POA: Diagnosis not present

## 2015-09-06 DIAGNOSIS — Z1159 Encounter for screening for other viral diseases: Secondary | ICD-10-CM

## 2015-09-06 DIAGNOSIS — R7301 Impaired fasting glucose: Secondary | ICD-10-CM

## 2015-09-06 DIAGNOSIS — Z114 Encounter for screening for human immunodeficiency virus [HIV]: Secondary | ICD-10-CM

## 2015-09-06 NOTE — Progress Notes (Signed)
Pre visit review using our clinic review tool, if applicable. No additional management support is needed unless otherwise documented below in the visit note. 

## 2015-09-06 NOTE — Progress Notes (Signed)
Office Note 09/06/2015  CC:  Chief Complaint  Patient presents with  . Annual Exam    labs done 08/30/15    HPI:  Bonnie [name] is a 65 y.o. White female who is here for annual health maintenance exam. Last Pap and mammogram were 10/2014 via Dr. Ouida Sills, her Gynecologist.  She has f/u 10/11/15 with him. [name] reviewed her recent fasting health panel labs + A1c in detail today: all good.  Good diet and exercise habits still. Eye exam 04/2015: UTD.  No acute complaints.  Past Medical History  Diagnosis Date  . Genital herpes in women 02-17-09    she "plays with" the dose: 1/2-1 tab per day  . Lactose intolerance     ?  . Multiple allergies 08/02/2010  . Impaired fasting glucose 2014    Saw nutritionist (HbA1c 6.1% 02/2014).  A1c 5.7% 08/2015.  . Vallecular cyst 04/09/15    ? cause of chronic cough?--Dr. Simeon Craft (ENT) removed this and her cough resolved.    Past Surgical History  Procedure Laterality Date  . Cataract surgery  4-12 and 5-12  . Broken leg  6 months old  . Teeth pulled  65 yrs old  . Cosmetic surgery  65 yrs old    nose  . Colonoscopy  2009    recall 10 yrs  . Vallecular cyst excision  2017    cough resolved after this was removed.    Family History  Problem Relation Age of Onset  . Heart disease Father     passed after by pass surgery  . Hypertension Father   . Hyperlipidemia Father   . Other Sister     auto immune issues  . Gout Sister   . Autoimmune disease Sister     fatigue, rashes  . Osteopenia Sister   . Osteoporosis Mother     Social History   Social History  . Marital Status: Married    Spouse Name: N/A  . Number of Children: N/A  . Years of Education: N/A   Occupational History  . Not on file.   Social History Main Topics  . Smoking status: Never Smoker   . Smokeless tobacco: Never Used  . Alcohol Use: No     Comment: 1-2 beers a week or 1-2 glasses of wine a week  . Drug Use: No  . Sexual Activity:    Partners: Male   Other  Topics Concern  . Not on file   Social History Narrative   Married, 2 grown children.   Lives in [place], [place] from [place].   Occupation: Optician, dispensing   No tob, 1-2 beer/wine per week.  No hx of drug abuse or alcohol probs.   Enjoys outdoors, snow skiing, swimming, running, hiking.   MEDS: Estrace vag cream once weekly Outpatient Prescriptions Prior to Visit  Medication Sig Dispense Refill  . acyclovir (ZOVIRAX) 400 MG tablet Take 400 mg by mouth 2 (two) times daily.    . Calcium Carbonate-Vitamin D (CALCIUM-VITAMIN D) 500-200 MG-UNIT per tablet Take 1 tablet by mouth daily.      Marland Kitchen KRILL OIL PO Take 500 mg by mouth daily. Mega Red    . multivitamin (THERAGRAN) tablet Take 1 tablet by mouth daily. One a day     No facility-administered medications prior to visit.    Allergies  Allergen Reactions  . Cyclorex [Cyclandelate]     ROS Review of Systems  Constitutional: Negative for fever, chills, appetite change and fatigue.  HENT: Negative  for congestion, dental problem, ear pain and sore throat.   Eyes: Negative for discharge, redness and visual disturbance.  Respiratory: Negative for cough, chest tightness, shortness of breath and wheezing.   Cardiovascular: Negative for chest pain, palpitations and leg swelling.  Gastrointestinal: Negative for nausea, vomiting, abdominal pain, diarrhea and blood in stool.  Genitourinary: Negative for dysuria, urgency, frequency, hematuria, flank pain and difficulty urinating.  Musculoskeletal: Negative for myalgias, back pain, joint swelling, arthralgias and neck stiffness.  Skin: Negative for pallor and rash.  Neurological: Negative for dizziness, speech difficulty, weakness and headaches.  Hematological: Negative for adenopathy. Does not bruise/bleed easily.  Psychiatric/Behavioral: Negative for confusion and sleep disturbance. The patient is not nervous/anxious.     PE; Blood pressure 127/78, pulse 69, temperature 97.8 F (36.6 C),  temperature source Oral, resp. rate 16, height 5' 4.75" (1.645 m), weight 126 lb 4 oz (57.267 kg), SpO2 100 %.  Pt examined with Sharen Hones, CMA, as chaperone.  Gen: Alert, well appearing.  Patient is oriented to person, place, time, and situation. AFFECT: pleasant, lucid thought and speech. ENT: Ears: EACs clear, normal epithelium.  TMs with good light reflex and landmarks bilaterally.  Eyes: no injection, icteris, swelling, or exudate.  EOMI, PERRLA. Nose: no drainage or turbinate edema/swelling.  No injection or focal lesion.  Mouth: lips without lesion/swelling.  Oral mucosa pink and moist.  Dentition intact and without obvious caries or gingival swelling.  Oropharynx without erythema, exudate, or swelling.  Neck: supple/nontender.  No LAD, mass, or TM.  Carotid pulses 2+ bilaterally, without bruits. CV: RRR, no m/r/g.   LUNGS: CTA bilat, nonlabored resps, good aeration in all lung fields. ABD: soft, NT, ND, BS normal.  No hepatospenomegaly or mass.  No bruits. EXT: no clubbing, cyanosis, or edema.  Musculoskeletal: no joint swelling, erythema, warmth, or tenderness.  ROM of all joints intact. Skin - no sores or suspicious lesions or rashes or color changes   Pertinent labs:  Lab Results  Component Value Date   TSH 1.77 08/30/2015   Lab Results  Component Value Date   WBC 3.8* 08/30/2015   HGB 12.2 08/30/2015   HCT 36.7 08/30/2015   MCV 84.7 08/30/2015   PLT 183.0 08/30/2015   Lab Results  Component Value Date   CREATININE 0.76 08/30/2015   BUN 14 08/30/2015   NA 138 08/30/2015   K 4.7 08/30/2015   CL 102 08/30/2015   CO2 28 08/30/2015   Lab Results  Component Value Date   ALT 15 08/30/2015   AST 18 08/30/2015   ALKPHOS 67 08/30/2015   BILITOT 0.5 08/30/2015   Lab Results  Component Value Date   CHOL 177 08/30/2015   Lab Results  Component Value Date   HDL 65.10 08/30/2015   Lab Results  Component Value Date   LDLCALC 101* 08/30/2015   Lab Results   Component Value Date   TRIG 54.0 08/30/2015   Lab Results  Component Value Date   CHOLHDL 3 08/30/2015   Lab Results  Component Value Date   HGBA1C 5.7 08/30/2015    ASSESSMENT AND PLAN:   Health maintenance exam: Reviewed age and gender appropriate health maintenance issues (prudent diet, regular exercise, health risks of tobacco and excessive alcohol, use of seatbelts, fire alarms in home, use of sunscreen).  Also reviewed age and gender appropriate health screening as well as vaccine recommendations. HP labs + Hba1c good.  [name]'ll have her return for 6 mo fasting lab visit to recheck fasting  glucose and Hba1c.  She'll also get HIV and Hep C screening at that time. Colon cancer screening: next colonoscopy due 2019. Cervical ca and breast ca screening: UTD--via GYN.  An After Visit Summary was printed and given to the patient.  FOLLOW UP:  Return in about 1 year (around 09/05/2016) for annual CPE with fasting labs the week prior.  Also, needs fasting lab visit in 6 months..  Signed:  Crissie Sickles, MD           09/06/2015

## 2015-10-04 ENCOUNTER — Ambulatory Visit: Payer: Managed Care, Other (non HMO) | Admitting: Family Medicine

## 2015-10-19 DIAGNOSIS — M9260 Juvenile osteochondrosis of tarsus, unspecified ankle: Secondary | ICD-10-CM

## 2015-10-19 HISTORY — DX: Juvenile osteochondrosis of tarsus, unspecified ankle: M92.60

## 2015-10-31 ENCOUNTER — Ambulatory Visit (INDEPENDENT_AMBULATORY_CARE_PROVIDER_SITE_OTHER): Payer: Managed Care, Other (non HMO) | Admitting: Podiatry

## 2015-10-31 ENCOUNTER — Encounter: Payer: Self-pay | Admitting: Podiatry

## 2015-10-31 VITALS — BP 116/71 | HR 70 | Ht 64.25 in | Wt 126.0 lb

## 2015-10-31 DIAGNOSIS — M216X1 Other acquired deformities of right foot: Secondary | ICD-10-CM

## 2015-10-31 DIAGNOSIS — M779 Enthesopathy, unspecified: Secondary | ICD-10-CM

## 2015-10-31 DIAGNOSIS — M7661 Achilles tendinitis, right leg: Secondary | ICD-10-CM

## 2015-10-31 NOTE — Patient Instructions (Addendum)
Seen for pain in right heel. Noted of tight Achilles tendon right, enlarged heel at tendon attachment site with heel spur formation. May benefit using Night Splint, elevated heels, modify exercise till symptom subside. Return as needed.

## 2015-10-31 NOTE — Progress Notes (Signed)
SUBJECTIVE: 65 y.o. year old female presents complaining of painful bump on back of right heel since May 2017. She used to do stretch exercise but had to stop due to pain in the back of right heel. Does running exercise 2-3 miles a day 4 times a week. Not awful but occasional pain.  History of right knee pain with right thigh muscle getting tight at times started 3-4 years ago.   REVIEW OF SYSTEMS: Pertinent items noted in HPI and remainder of comprehensive ROS otherwise negative.  OBJECTIVE: DERMATOLOGIC EXAMINATION: No abnormal skin lesions noted.   VASCULAR EXAMINATION OF LOWER LIMBS: Pedal pulses: All pedal pulses are not palpable bilateral, PT and DP. Temperature gradient from tibial crest to dorsum of foot is within normal bilateral.  NEUROLOGIC EXAMINATION OF THE LOWER LIMBS: All epicritic and tactile sensations grossly intact.  Sharp and Dull discriminatory sensations at the plantar ball of hallux is intact bilateral.   MUSCULOSKELETAL EXAMINATION: Positive for tight Achilles tendon right. Positive for enlarged posterior heel at Achilles tendon insertion site.  Radiographic examination reveal high arched cavus type foot, soft tissue swelling at posterior achilles tendon attachment site, presence of posterior calcaneal spur, with separated ossicle in posterior bursa just superior anterior to proximal end of spur right. Finding is consistent with acute achilles tendonitis right.    ASSESSMENT: Haglund's deformity with posterior calcaneal heel spur. Achilles tendonitis right.  Ankle equinus right.  PLAN: Reviewed clinical findings and available treatment options. Night Splint dispensed with instruction. Discussed exercise and shoe modification, avoid flat shoes, use elevated heel while symptomatic. Return if pain continues.

## 2015-11-01 ENCOUNTER — Encounter: Payer: Self-pay | Admitting: Family Medicine

## 2016-02-18 DIAGNOSIS — M7661 Achilles tendinitis, right leg: Secondary | ICD-10-CM

## 2016-02-18 HISTORY — DX: Achilles tendinitis, right leg: M76.61

## 2016-03-19 ENCOUNTER — Other Ambulatory Visit (INDEPENDENT_AMBULATORY_CARE_PROVIDER_SITE_OTHER): Payer: Managed Care, Other (non HMO)

## 2016-03-19 DIAGNOSIS — Z114 Encounter for screening for human immunodeficiency virus [HIV]: Secondary | ICD-10-CM

## 2016-03-19 DIAGNOSIS — R7301 Impaired fasting glucose: Secondary | ICD-10-CM | POA: Diagnosis not present

## 2016-03-19 DIAGNOSIS — Z1159 Encounter for screening for other viral diseases: Secondary | ICD-10-CM

## 2016-03-19 LAB — HEMOGLOBIN A1C: Hgb A1c MFr Bld: 6.1 % (ref 4.6–6.5)

## 2016-03-19 LAB — GLUCOSE, RANDOM: Glucose, Bld: 92 mg/dL (ref 70–99)

## 2016-03-20 LAB — HIV ANTIBODY (ROUTINE TESTING W REFLEX): HIV 1&2 Ab, 4th Generation: NONREACTIVE

## 2016-03-20 LAB — HEPATITIS C ANTIBODY: HCV Ab: NEGATIVE

## 2016-04-23 ENCOUNTER — Ambulatory Visit (INDEPENDENT_AMBULATORY_CARE_PROVIDER_SITE_OTHER): Payer: Managed Care, Other (non HMO) | Admitting: Podiatry

## 2016-04-23 VITALS — BP 134/77 | HR 65

## 2016-04-23 DIAGNOSIS — M779 Enthesopathy, unspecified: Secondary | ICD-10-CM

## 2016-04-23 DIAGNOSIS — M216X1 Other acquired deformities of right foot: Secondary | ICD-10-CM

## 2016-04-23 DIAGNOSIS — M216X2 Other acquired deformities of left foot: Secondary | ICD-10-CM | POA: Diagnosis not present

## 2016-04-23 DIAGNOSIS — M7661 Achilles tendinitis, right leg: Secondary | ICD-10-CM

## 2016-04-23 NOTE — Progress Notes (Signed)
SUBJECTIVE: 66 y.o. year old female presents stating pain has not improved. Unable to run. Wearing heel helps.  HPI:  Has painful bump on back of right heel since May 2017. She used to do stretch exercise but had to stop due to pain in the back of right heel. Does running exercise 2-3 miles a day 4 times a week. Not awful but occasional pain.  History of right knee pain with right thigh muscle getting tight at times started 3-4 years ago.   REVIEW OF SYSTEMS: Pertinent items noted in HPI and remainder of comprehensive ROS otherwise negative.  OBJECTIVE: DERMATOLOGIC EXAMINATION: No abnormal skin lesions noted.   VASCULAR EXAMINATION OF LOWER LIMBS: Pedal pulses: All pedal pulses are not palpable bilateral, PT and DP. Temperature gradient from tibial crest to dorsum of foot is within normal bilateral.  NEUROLOGIC EXAMINATION OF THE LOWER LIMBS: All epicritic and tactile sensations grossly intact.  Sharp and Dull discriminatory sensations at the plantar ball of hallux is intact bilateral.   MUSCULOSKELETAL EXAMINATION: Positive for tight Achilles tendon right. Positive for enlarged posterior heel at Achilles tendon insertion site.  Previous x-ray show no acute changes at painful site.   ASSESSMENT: Haglund's deformity with posterior calcaneal heel spur. Achilles tendonitis right.  Ankle equinus right.  PLAN: Reviewed clinical findings and available treatment options. Instructed daily stretch exercise. Reviewed use of Arch binder, custom orthotics.

## 2016-04-24 ENCOUNTER — Encounter: Payer: Self-pay | Admitting: Podiatry

## 2016-04-24 NOTE — Patient Instructions (Signed)
Both feet casted for Orthotics. 

## 2016-06-12 ENCOUNTER — Ambulatory Visit (INDEPENDENT_AMBULATORY_CARE_PROVIDER_SITE_OTHER): Payer: Managed Care, Other (non HMO) | Admitting: Podiatry

## 2016-06-12 ENCOUNTER — Encounter: Payer: Self-pay | Admitting: Podiatry

## 2016-06-12 DIAGNOSIS — M779 Enthesopathy, unspecified: Secondary | ICD-10-CM

## 2016-06-12 DIAGNOSIS — M7661 Achilles tendinitis, right leg: Secondary | ICD-10-CM

## 2016-06-12 NOTE — Patient Instructions (Signed)
1 month follow up on orthotic check. Doing well.  Reviewed further orthotic and shoe selections. Answered all questions. Continue current level of activity. Return as needed.

## 2016-06-12 NOTE — Progress Notes (Signed)
Stated that she is doing better. Not running up hill, only flat surface.  Will be back with running practice. Ok to wear raised heels or dress shoes for short period. Any other times should stay in orthotics with tie up shoes. Continue with Arch binder as needed. Return as needed.

## 2016-06-13 ENCOUNTER — Encounter: Payer: Self-pay | Admitting: Family Medicine

## 2016-08-11 HISTORY — PX: INCISION AND DRAINAGE: SHX5863

## 2016-08-12 ENCOUNTER — Ambulatory Visit: Payer: Managed Care, Other (non HMO) | Admitting: Family Medicine

## 2016-08-12 ENCOUNTER — Encounter: Payer: Self-pay | Admitting: Family Medicine

## 2016-08-12 NOTE — Progress Notes (Deleted)
OFFICE VISIT  08/12/2016   CC: No chief complaint on file.    HPI:    Patient is a 66 y.o. Caucasian female who presents for rash.  Past Medical History:  Diagnosis Date  . Genital herpes in women 02-17-09   she "plays with" the dose: 1/2-1 tab per day  . Haglund's deformity 10/2015   Right; with posterior calcaneal heel spur (Podiatrist)  . Impaired fasting glucose 2014   Saw nutritionist (HbA1c 6.1% 02/2014).  A1c 5.7% 08/2015.  . Lactose intolerance    ?  . Multiple allergies 08/02/2010  . Tendonitis, Achilles, right 2018   Podiatrist  . Vallecular cyst 04/09/15   ? cause of chronic cough?--Dr. Simeon Craft (ENT) removed this and her cough resolved.    Past Surgical History:  Procedure Laterality Date  . broken leg  6 months old  . cataract surgery  4-12 and 5-12  . COLONOSCOPY  2009   recall 10 yrs  . COSMETIC SURGERY  66 yrs old   nose  . teeth pulled  66 yrs old  . Vallecular cyst excision  2017   cough resolved after this was removed.    Outpatient Medications Prior to Visit  Medication Sig Dispense Refill  . acyclovir (ZOVIRAX) 400 MG tablet Take 400 mg by mouth 2 (two) times daily.    . Calcium Carbonate-Vitamin D (CALCIUM-VITAMIN D) 500-200 MG-UNIT per tablet Take 1 tablet by mouth daily.      Marland Kitchen estradiol (ESTRACE) 0.1 MG/GM vaginal cream Place 1 Applicatorful vaginally once a week.    Marland Kitchen KRILL OIL PO Take 500 mg by mouth daily. Mega Red    . multivitamin (THERAGRAN) tablet Take 1 tablet by mouth daily. One a day     No facility-administered medications prior to visit.     Allergies  Allergen Reactions  . Cyclorex [Cyclandelate]     ROS As per HPI  PE: There were no vitals taken for this visit. ***  LABS:  ***  IMPRESSION AND PLAN:  No problem-specific Assessment & Plan notes found for this encounter.   FOLLOW UP: No Follow-up on file.

## 2016-09-04 ENCOUNTER — Other Ambulatory Visit (INDEPENDENT_AMBULATORY_CARE_PROVIDER_SITE_OTHER): Payer: Managed Care, Other (non HMO)

## 2016-09-04 DIAGNOSIS — Z0000 Encounter for general adult medical examination without abnormal findings: Secondary | ICD-10-CM | POA: Diagnosis not present

## 2016-09-04 DIAGNOSIS — R7309 Other abnormal glucose: Secondary | ICD-10-CM

## 2016-09-04 LAB — CBC WITH DIFFERENTIAL/PLATELET
Basophils Absolute: 0 10*3/uL (ref 0.0–0.1)
Basophils Relative: 0.7 % (ref 0.0–3.0)
Eosinophils Absolute: 0.1 10*3/uL (ref 0.0–0.7)
Eosinophils Relative: 2 % (ref 0.0–5.0)
HCT: 38 % (ref 36.0–46.0)
Hemoglobin: 12.4 g/dL (ref 12.0–15.0)
Lymphocytes Relative: 35 % (ref 12.0–46.0)
Lymphs Abs: 1.3 10*3/uL (ref 0.7–4.0)
MCHC: 32.5 g/dL (ref 30.0–36.0)
MCV: 87 fl (ref 78.0–100.0)
Monocytes Absolute: 0.4 10*3/uL (ref 0.1–1.0)
Monocytes Relative: 9.3 % (ref 3.0–12.0)
Neutro Abs: 2 10*3/uL (ref 1.4–7.7)
Neutrophils Relative %: 53 % (ref 43.0–77.0)
Platelets: 205 10*3/uL (ref 150.0–400.0)
RBC: 4.37 Mil/uL (ref 3.87–5.11)
RDW: 13.6 % (ref 11.5–15.5)
WBC: 3.9 10*3/uL — ABNORMAL LOW (ref 4.0–10.5)

## 2016-09-04 LAB — COMPREHENSIVE METABOLIC PANEL
ALT: 17 U/L (ref 0–35)
AST: 19 U/L (ref 0–37)
Albumin: 4.2 g/dL (ref 3.5–5.2)
Alkaline Phosphatase: 74 U/L (ref 39–117)
BUN: 12 mg/dL (ref 6–23)
CO2: 31 mEq/L (ref 19–32)
Calcium: 9.7 mg/dL (ref 8.4–10.5)
Chloride: 103 mEq/L (ref 96–112)
Creatinine, Ser: 0.72 mg/dL (ref 0.40–1.20)
GFR: 86.67 mL/min (ref >60.00)
Glucose, Bld: 86 mg/dL (ref 70–99)
Potassium: 4.4 mEq/L (ref 3.5–5.1)
Sodium: 138 mEq/L (ref 135–145)
Total Bilirubin: 0.5 mg/dL (ref 0.2–1.2)
Total Protein: 6.4 g/dL (ref 6.0–8.3)

## 2016-09-04 LAB — HEMOGLOBIN A1C: Hgb A1c MFr Bld: 6 % (ref 4.6–6.5)

## 2016-09-04 LAB — LIPID PANEL
Cholesterol: 178 mg/dL (ref 0–200)
HDL: 73.5 mg/dL (ref >39.00)
LDL Cholesterol: 93 mg/dL (ref 0–99)
NonHDL: 104.83
Total CHOL/HDL Ratio: 2
Triglycerides: 57 mg/dL (ref 0.0–149.0)
VLDL: 11.4 mg/dL (ref 0.0–40.0)

## 2016-09-04 LAB — TSH: TSH: 2.31 u[IU]/mL (ref 0.35–4.50)

## 2016-09-11 ENCOUNTER — Ambulatory Visit (INDEPENDENT_AMBULATORY_CARE_PROVIDER_SITE_OTHER): Payer: Managed Care, Other (non HMO) | Admitting: Family Medicine

## 2016-09-11 ENCOUNTER — Other Ambulatory Visit: Payer: Self-pay | Admitting: Family Medicine

## 2016-09-11 ENCOUNTER — Encounter: Payer: Self-pay | Admitting: Family Medicine

## 2016-09-11 VITALS — BP 109/73 | HR 71 | Temp 98.0°F | Resp 16 | Ht 64.25 in | Wt 125.0 lb

## 2016-09-11 DIAGNOSIS — H9190 Unspecified hearing loss, unspecified ear: Secondary | ICD-10-CM | POA: Diagnosis not present

## 2016-09-11 DIAGNOSIS — Z0000 Encounter for general adult medical examination without abnormal findings: Secondary | ICD-10-CM

## 2016-09-11 DIAGNOSIS — R7303 Prediabetes: Secondary | ICD-10-CM

## 2016-09-11 NOTE — Patient Instructions (Signed)

## 2016-09-11 NOTE — Progress Notes (Signed)
Office Note 09/11/2016  CC:  Chief Complaint  Patient presents with  . Annual Exam    CPE    HPI:  Bonnie [name] is a 66 y.o. White female who is here for annual health maintenance exam. Recent fasting health panel labs were discussed in detail with pt today.  All of these were normal except her HbA1c--which was stable at 6.1%.  Most recent mammogram in our EMR is from 2016--normal. Dr. Freda Munro is her GYN MD.  Pt thinks she may be having hearing impairment bilat: has trouble hearing.  Wants hearing screen today for starters.  C/o bilat shoulder stiffness, occ feeling "stuck" when rotating it.  Seems to come and go (from non-existent to days of bothering her all the time).  R worse than L.   Past Medical History:  Diagnosis Date  . Genital herpes in women 02-17-09   she "plays with" the dose: 1/2-1 tab per day  . Haglund's deformity 10/2015   Right; with posterior calcaneal heel spur (Podiatrist)  . Impaired fasting glucose 2014   Saw nutritionist (HbA1c 6.1% 02/2014).  A1c 5.7% 08/2015.  . Lactose intolerance    ?  . Multiple allergies 08/02/2010  . Tendonitis, Achilles, right 2018   Podiatrist  . Vallecular cyst 04/09/15   ? cause of chronic cough?--Dr. Simeon Craft (ENT) removed this and her cough resolved.    Past Surgical History:  Procedure Laterality Date  . broken leg  6 months old  . cataract surgery  4-12 and 5-12  . COLONOSCOPY  2009   recall 10 yrs  . COSMETIC SURGERY  67 yrs old   nose  . INCISION AND DRAINAGE  08/11/2016   Epidermal inclusion cyst on back (done by MD at [facility] in [place]).  Marland Kitchen teeth pulled  66 yrs old  . Vallecular cyst excision  2017   cough resolved after this was removed.    Family History  Problem Relation Age of Onset  . Heart disease Father        passed after by pass surgery  . Hypertension Father   . Hyperlipidemia Father   . Other Sister        auto immune issues  . Gout Sister   . Autoimmune disease  Sister        fatigue, rashes  . Osteopenia Sister   . Osteoporosis Mother     Social History   Social History  . Marital status: Married    Spouse name: N/A  . Number of children: N/A  . Years of education: N/A   Occupational History  . Not on file.   Social History Main Topics  . Smoking status: Never Smoker  . Smokeless tobacco: Never Used  . Alcohol use No     Comment: 1-2 beers a week or 1-2 glasses of wine a week  . Drug use: No  . Sexual activity: Yes    Partners: Male   Other Topics Concern  . Not on file   Social History Narrative   Married, 2 grown children.   Lives in [place], [place] from [place].   Occupation: Optician, dispensing   No tob, 1-2 beer/wine per week.  No hx of drug abuse or alcohol probs.   Enjoys outdoors, snow skiing, swimming, running, hiking.    Outpatient Medications Prior to Visit  Medication Sig Dispense Refill  . acyclovir (ZOVIRAX) 400 MG tablet Take 400 mg by mouth 2 (two) times daily.    . Calcium  Carbonate-Vitamin D (CALCIUM-VITAMIN D) 500-200 MG-UNIT per tablet Take 1 tablet by mouth daily.      Marland Kitchen estradiol (ESTRACE) 0.1 MG/GM vaginal cream Place 1 Applicatorful vaginally once a week.    Marland Kitchen KRILL OIL PO Take 500 mg by mouth daily. Mega Red    . multivitamin (THERAGRAN) tablet Take 1 tablet by mouth daily. One a day     No facility-administered medications prior to visit.     Allergies  Allergen Reactions  . Cyclorex [Cyclandelate]     ROS Review of Systems  Constitutional: Negative for appetite change, chills, fatigue and fever.  HENT: Positive for hearing loss (question of). Negative for congestion, dental problem, ear pain and sore throat.   Eyes: Negative for discharge, redness and visual disturbance.  Respiratory: Negative for cough, chest tightness, shortness of breath and wheezing.   Cardiovascular: Negative for chest pain, palpitations and leg swelling.  Gastrointestinal: Negative for abdominal pain, blood in stool,  diarrhea, nausea and vomiting.  Endocrine: Negative for polydipsia, polyphagia and polyuria.  Genitourinary: Negative for difficulty urinating, dysuria, flank pain, frequency, hematuria and urgency.  Musculoskeletal: Positive for arthralgias (occ shoulders, as in HPI above). Negative for back pain, joint swelling, myalgias and neck stiffness.  Skin: Negative for pallor and rash.  Neurological: Negative for dizziness, speech difficulty, weakness and headaches.  Hematological: Negative for adenopathy. Does not bruise/bleed easily.  Psychiatric/Behavioral: Negative for confusion and sleep disturbance. The patient is not nervous/anxious.     PE; Blood pressure 109/73, pulse 71, temperature 98 F (36.7 C), temperature source Oral, resp. rate 16, height 5' 4.25" (1.632 m), weight 125 lb (56.7 kg), SpO2 99 %. Body mass index is 21.29 kg/m.  Gen: Alert, well appearing.  Patient is oriented to person, place, time, and situation. AFFECT: pleasant, lucid thought and speech. ENT: Ears: EACs clear, normal epithelium.  TMs with good light reflex and landmarks bilaterally.  Eyes: no injection, icteris, swelling, or exudate.  EOMI, PERRLA. Nose: no drainage or turbinate edema/swelling.  No injection or focal lesion.  Mouth: lips without lesion/swelling.  Oral mucosa pink and moist.  Dentition intact and without obvious caries or gingival swelling.  Oropharynx without erythema, exudate, or swelling.  Neck: supple/nontender.  No LAD, mass, or TM.  Carotid pulses 2+ bilaterally, without bruits. CV: RRR, no m/r/g.   LUNGS: CTA bilat, nonlabored resps, good aeration in all lung fields. ABD: soft, NT, ND, BS normal.  No hepatospenomegaly or mass.  No bruits. EXT: no clubbing, cyanosis, or edema.  Musculoskeletal: no joint swelling, erythema, warmth, or tenderness.  ROM of all joints intact. Shoulders: no pain with ROM except mild upper scapular muscle pain with some ROM.  No impingement sign. Mild pop sound  heard with impingement testing on R shoulder but no pain associated with this. No UE weakness or tenderness to palpation in shoulders. Skin - no sores or suspicious lesions or rashes or color changes   Pertinent labs:  Lab Results  Component Value Date   TSH 2.31 09/04/2016   Lab Results  Component Value Date   WBC 3.9 (L) 09/04/2016   HGB 12.4 09/04/2016   HCT 38.0 09/04/2016   MCV 87.0 09/04/2016   PLT 205.0 09/04/2016   Lab Results  Component Value Date   CREATININE 0.72 09/04/2016   BUN 12 09/04/2016   NA 138 09/04/2016   K 4.4 09/04/2016   CL 103 09/04/2016   CO2 31 09/04/2016   Lab Results  Component Value Date   ALT  17 09/04/2016   AST 19 09/04/2016   ALKPHOS 74 09/04/2016   BILITOT 0.5 09/04/2016   Lab Results  Component Value Date   CHOL 178 09/04/2016   Lab Results  Component Value Date   HDL 73.50 09/04/2016   Lab Results  Component Value Date   LDLCALC 93 09/04/2016   Lab Results  Component Value Date   TRIG 57.0 09/04/2016   Lab Results  Component Value Date   CHOLHDL 2 09/04/2016   Lab Results  Component Value Date   HGBA1C 6.0 09/04/2016    ASSESSMENT AND PLAN:   Health maintenance exam: Reviewed age and gender appropriate health maintenance issues (prudent diet, regular exercise, health risks of tobacco and excessive alcohol, use of seatbelts, fire alarms in home, use of sunscreen).  Also reviewed age and gender appropriate health screening as well as vaccine recommendations. Vaccines: UTD.  Shingrix discussed--she'll get this at next physical exam. Labs: reviewed recent fasting HP and HbA1c (prediabetes) in detail: all good. She really wants to come to lab and recheck these in 6 months "because I've always done that". Cervical ca screening: Pap/pelvic done 10/11/15--next one due 09/2017 (GYN MD). Breast ca screening: mammogram done 08/2016--normal. Colon ca screening: next colonoscopy due 2019. Osteoporosis screening: bone density in  EMR was 2014 and showed osteopenia (T-score -1.7). Recommend repeat DEXA 2019. She takes calcium and vit D daily. Hearing loss c/o: audiogram normal here today.  She declines referral to audiology at this time.  An After Visit Summary was printed and given to the patient.  FOLLOW UP:  Return in about 1 year (around 09/11/2017) for annual CPE with fasting labs the week prior.  Signed:  Crissie Sickles, MD           09/11/2016

## 2016-09-17 DIAGNOSIS — Z8781 Personal history of (healed) traumatic fracture: Secondary | ICD-10-CM

## 2016-09-17 HISTORY — DX: Personal history of (healed) traumatic fracture: Z87.81

## 2016-09-23 ENCOUNTER — Encounter: Payer: Self-pay | Admitting: Family Medicine

## 2016-09-25 DIAGNOSIS — M20012 Mallet finger of left finger(s): Secondary | ICD-10-CM | POA: Insufficient documentation

## 2016-09-25 DIAGNOSIS — M25542 Pain in joints of left hand: Secondary | ICD-10-CM | POA: Insufficient documentation

## 2017-03-12 ENCOUNTER — Other Ambulatory Visit (INDEPENDENT_AMBULATORY_CARE_PROVIDER_SITE_OTHER): Payer: Managed Care, Other (non HMO)

## 2017-03-12 DIAGNOSIS — R7303 Prediabetes: Secondary | ICD-10-CM | POA: Diagnosis not present

## 2017-03-12 LAB — HEMOGLOBIN A1C: Hgb A1c MFr Bld: 6 % (ref 4.6–6.5)

## 2017-07-28 ENCOUNTER — Encounter: Payer: Self-pay | Admitting: Family Medicine

## 2017-07-28 ENCOUNTER — Telehealth: Payer: Self-pay | Admitting: Family Medicine

## 2017-07-28 DIAGNOSIS — E2839 Other primary ovarian failure: Secondary | ICD-10-CM

## 2017-07-28 DIAGNOSIS — M8580 Other specified disorders of bone density and structure, unspecified site: Secondary | ICD-10-CM

## 2017-07-28 NOTE — Telephone Encounter (Signed)
Pt requesting order for Bone Density Scan. Please advise. Thanks.   SN: Pts last colonoscopy was 12/20/07, she's on a 10 year recall, she is not due till November this year.

## 2017-07-28 NOTE — Telephone Encounter (Signed)
Left message for pt to call back  °

## 2017-07-28 NOTE — Telephone Encounter (Signed)
Copied from [place] [id number]. Topic: Inquiry >> Jul 28, 2017 10:10 AM [name], NT wrote: Reason for CRM: The patient called and said she would like to have a   density scan done ,and she would like this done at [facility] due to it being close to her job.She also would like  colonoscopy scheduled with Dr Collene Mares before her CPE  in July so the results can be discussed at her visit , please call her at  [phone] work she says it is ok to leave a detailed  voice message .

## 2017-07-28 NOTE — Telephone Encounter (Signed)
Bone density test ordered per pt request. She can call Dr. Lorie Apley office herself to schedule colonoscopy since she got her other colonoscopy with her -->no referral needed.

## 2017-07-28 NOTE — Telephone Encounter (Signed)
Pt advised and voiced understanding.   

## 2017-07-30 ENCOUNTER — Encounter: Payer: Self-pay | Admitting: Family Medicine

## 2017-07-30 ENCOUNTER — Other Ambulatory Visit (HOSPITAL_BASED_OUTPATIENT_CLINIC_OR_DEPARTMENT_OTHER): Payer: Self-pay | Admitting: Radiology

## 2017-07-30 ENCOUNTER — Ambulatory Visit (HOSPITAL_BASED_OUTPATIENT_CLINIC_OR_DEPARTMENT_OTHER)
Admission: RE | Admit: 2017-07-30 | Discharge: 2017-07-30 | Disposition: A | Discharge status: Home / Self Care | Payer: Managed Care, Other (non HMO) | Source: Ambulatory Visit | Attending: Family Medicine | Admitting: Family Medicine

## 2017-07-30 DIAGNOSIS — M85851 Other specified disorders of bone density and structure, right thigh: Secondary | ICD-10-CM | POA: Insufficient documentation

## 2017-07-30 DIAGNOSIS — E2839 Other primary ovarian failure: Secondary | ICD-10-CM | POA: Diagnosis not present

## 2017-07-30 DIAGNOSIS — M8580 Other specified disorders of bone density and structure, unspecified site: Secondary | ICD-10-CM

## 2017-09-03 ENCOUNTER — Encounter: Payer: Self-pay | Admitting: Family Medicine

## 2017-09-03 ENCOUNTER — Other Ambulatory Visit (INDEPENDENT_AMBULATORY_CARE_PROVIDER_SITE_OTHER): Payer: Managed Care, Other (non HMO)

## 2017-09-03 DIAGNOSIS — Z0000 Encounter for general adult medical examination without abnormal findings: Secondary | ICD-10-CM

## 2017-09-03 DIAGNOSIS — R7301 Impaired fasting glucose: Secondary | ICD-10-CM

## 2017-09-03 LAB — COMPREHENSIVE METABOLIC PANEL
ALT: 18 U/L (ref 0–35)
AST: 22 U/L (ref 0–37)
Albumin: 4.4 g/dL (ref 3.5–5.2)
Alkaline Phosphatase: 73 U/L (ref 39–117)
BUN: 18 mg/dL (ref 6–23)
CO2: 32 mEq/L (ref 19–32)
Calcium: 9.7 mg/dL (ref 8.4–10.5)
Chloride: 100 mEq/L (ref 96–112)
Creatinine, Ser: 0.83 mg/dL (ref 0.40–1.20)
GFR: 73.33 mL/min (ref >60.00)
Glucose, Bld: 95 mg/dL (ref 70–99)
Potassium: 4.7 mEq/L (ref 3.5–5.1)
Sodium: 136 mEq/L (ref 135–145)
Total Bilirubin: 0.6 mg/dL (ref 0.2–1.2)
Total Protein: 6.9 g/dL (ref 6.0–8.3)

## 2017-09-03 LAB — CBC WITH DIFFERENTIAL/PLATELET
Basophils Absolute: 0.1 10*3/uL (ref 0.0–0.1)
Basophils Relative: 1.4 % (ref 0.0–3.0)
Eosinophils Absolute: 0.1 10*3/uL (ref 0.0–0.7)
Eosinophils Relative: 2 % (ref 0.0–5.0)
HCT: 39.7 % (ref 36.0–46.0)
Hemoglobin: 13.3 g/dL (ref 12.0–15.0)
Lymphocytes Relative: 38.8 % (ref 12.0–46.0)
Lymphs Abs: 1.5 10*3/uL (ref 0.7–4.0)
MCHC: 33.5 g/dL (ref 30.0–36.0)
MCV: 86.2 fl (ref 78.0–100.0)
Monocytes Absolute: 0.4 10*3/uL (ref 0.1–1.0)
Monocytes Relative: 9.6 % (ref 3.0–12.0)
Neutro Abs: 1.9 10*3/uL (ref 1.4–7.7)
Neutrophils Relative %: 48.2 % (ref 43.0–77.0)
Platelets: 195 10*3/uL (ref 150.0–400.0)
RBC: 4.6 Mil/uL (ref 3.87–5.11)
RDW: 13.6 % (ref 11.5–15.5)
WBC: 3.8 10*3/uL — ABNORMAL LOW (ref 4.0–10.5)

## 2017-09-03 LAB — LIPID PANEL
Cholesterol: 194 mg/dL (ref 0–200)
HDL: 72.7 mg/dL (ref >39.00)
LDL Cholesterol: 112 mg/dL — ABNORMAL HIGH (ref 0–99)
NonHDL: 121.43
Total CHOL/HDL Ratio: 3
Triglycerides: 48 mg/dL (ref 0.0–149.0)
VLDL: 9.6 mg/dL (ref 0.0–40.0)

## 2017-09-03 LAB — HEMOGLOBIN A1C: Hgb A1c MFr Bld: 6 % (ref 4.6–6.5)

## 2017-09-03 LAB — TSH: TSH: 1.94 u[IU]/mL (ref 0.35–4.50)

## 2017-09-10 ENCOUNTER — Encounter: Payer: Self-pay | Admitting: *Deleted

## 2017-09-10 DIAGNOSIS — M8580 Other specified disorders of bone density and structure, unspecified site: Secondary | ICD-10-CM | POA: Insufficient documentation

## 2017-09-11 ENCOUNTER — Ambulatory Visit (INDEPENDENT_AMBULATORY_CARE_PROVIDER_SITE_OTHER): Payer: Managed Care, Other (non HMO) | Admitting: Family Medicine

## 2017-09-11 ENCOUNTER — Encounter: Payer: Self-pay | Admitting: Family Medicine

## 2017-09-11 VITALS — BP 118/73 | HR 55 | Temp 98.0°F | Resp 16 | Ht 64.25 in | Wt 129.0 lb

## 2017-09-11 DIAGNOSIS — R42 Dizziness and giddiness: Secondary | ICD-10-CM

## 2017-09-11 DIAGNOSIS — Z0000 Encounter for general adult medical examination without abnormal findings: Secondary | ICD-10-CM | POA: Diagnosis not present

## 2017-09-11 DIAGNOSIS — Z23 Encounter for immunization: Secondary | ICD-10-CM

## 2017-09-11 NOTE — Addendum Note (Signed)
Addended by: Onalee Hua on: 09/11/2017 11:07 AM   Modules accepted: Orders

## 2017-09-11 NOTE — Progress Notes (Signed)
Office Note 09/11/2017  CC:  Chief Complaint  Patient presents with  . Annual Exam    Pt is not fasting.     HPI:  Bonnie [name] is a 67 y.o. White female who is here for annual health maintenance exam. GYN MD: Dr. Freda Munro.  Diet: good, moderately low carb. Exercise; runs 4 d/week, goes to the GYM other days, some swimming.  She is still working.  Yesterday she felt mildly lightheaded, constant.  No trigger identified.  Was not during or after exertion. Better today--just felt a bit lightheaded at the end of doing her rowing exercises (for a couple of minutes).  Does describe a bit of vertigo sensation when she put her head downward and closed her eyes (this happened once yesterday but not today). No palpitations or CP or SOB.  No nausea.  No ringing in ears.  No diminished hearing noted.  The periods of signif dizzy/?vertigo are only seconds to a minute at the most.  No HA.   Past Medical History:  Diagnosis Date  . Genital herpes in women 02-17-09   she "plays with" the dose: 1/2-1 tab per day  . Haglund's deformity 10/2015   Right; with posterior calcaneal heel spur (Podiatrist)  . History of fracture of phalanx of thumb 09/2016   [facility]: left thumb, possible impacted fracture of distal portion of proximal phalanx of thumb.  Splint rx'd and ortho f/u advised.  . Impaired fasting glucose 2014   Saw nutritionist (HbA1c 6.1% 02/2014).  A1c 5.7% 08/2015.  A1c 6.0% 08/2017.  . Lactose intolerance    ?  . Multiple allergies 08/02/2010  . Osteopenia 2014   2014 DEXA: Lowest tscore - 1.7 at left femoral neck.  07/2017 T-score -2.0 femur neck  . Tendonitis, Achilles, right 2018   Podiatrist  . Vallecular cyst 04/09/15   ? cause of chronic cough?--Dr. Simeon Craft (ENT) removed this and her cough resolved.    Past Surgical History:  Procedure Laterality Date  . broken leg  6 months old  . cataract surgery  4-12 and 5-12  . COLONOSCOPY  2009   recall 10 yrs  . COSMETIC  SURGERY  67 yrs old   nose  . DEXA  2014; 2019   2014 T-score -1.7.            07/2017 T-score -2.0 femur neck.  . INCISION AND DRAINAGE  08/11/2016   Epidermal inclusion cyst on back (done by MD at [facility] in [place]).  Marland Kitchen teeth pulled  67 yrs old  . Vallecular cyst excision  2017   cough resolved after this was removed.    Family History  Problem Relation Age of Onset  . Heart disease Father        passed after by pass surgery  . Hypertension Father   . Hyperlipidemia Father   . Other Sister        auto immune issues  . Gout Sister   . Autoimmune disease Sister        fatigue, rashes  . Osteopenia Sister   . Osteoporosis Mother     Social History   Socioeconomic History  . Marital status: Married    Spouse name: Not on file  . Number of children: Not on file  . Years of education: Not on file  . Highest education level: Not on file  Occupational History  . Not on file  Social Needs  . Financial resource strain: Not on file  .  Food insecurity:    Worry: Not on file    Inability: Not on file  . Transportation needs:    Medical: Not on file    Non-medical: Not on file  Tobacco Use  . Smoking status: Never Smoker  . Smokeless tobacco: Never Used  Substance and Sexual Activity  . Alcohol use: No    Comment: 1-2 beers a week or 1-2 glasses of wine a week  . Drug use: No  . Sexual activity: Yes    Partners: Male  Lifestyle  . Physical activity:    Days per week: Not on file    Minutes per session: Not on file  . Stress: Not on file  Relationships  . Social connections:    Talks on phone: Not on file    Gets together: Not on file    Attends religious service: Not on file    Active member of club or organization: Not on file    Attends meetings of clubs or organizations: Not on file    Relationship status: Not on file  . Intimate partner violence:    Fear of current or ex partner: Not on file    Emotionally abused: Not on file    Physically  abused: Not on file    Forced sexual activity: Not on file  Other Topics Concern  . Not on file  Social History Narrative   Married, 2 grown children.   Lives in [place], [place] from [place].   Occupation: Optician, dispensing   No tob, 1-2 beer/wine per week.  No hx of drug abuse or alcohol probs.   Enjoys outdoors, snow skiing, swimming, running, hiking.    Outpatient Medications Prior to Visit  Medication Sig Dispense Refill  . acyclovir (ZOVIRAX) 400 MG tablet Take 400 mg by mouth 2 (two) times daily.    . Calcium Carbonate-Vitamin D (CALCIUM-VITAMIN D) 500-200 MG-UNIT per tablet Take 1 tablet by mouth 2 (two) times daily.     Marland Kitchen estradiol (ESTRACE) 0.1 MG/GM vaginal cream Place 1 Applicatorful vaginally once a week.    . hydrocortisone 2.5 % cream APPLY SPARINGLY TO AFFECTED AREA 2 TO 4 TIMES A DAY  2  . multivitamin (THERAGRAN) tablet Take 1 tablet by mouth daily. One a day    . KRILL OIL PO Take 500 mg by mouth daily. Mega Red     No facility-administered medications prior to visit.     Allergies  Allergen Reactions  . Cefaclor Rash    ROS Review of Systems  Constitutional: Negative for appetite change, chills, fatigue and fever.  HENT: Negative for congestion, dental problem, ear pain and sore throat.   Eyes: Negative for discharge, redness and visual disturbance.  Respiratory: Negative for cough, chest tightness, shortness of breath and wheezing.   Cardiovascular: Negative for chest pain, palpitations and leg swelling.  Gastrointestinal: Negative for abdominal pain, blood in stool, diarrhea, nausea and vomiting.  Genitourinary: Negative for difficulty urinating, dysuria, flank pain, frequency, hematuria and urgency.  Musculoskeletal: Negative for arthralgias, back pain, joint swelling, myalgias and neck stiffness.  Skin: Negative for pallor and rash.  Neurological: Positive for dizziness (see hpi). Negative for speech difficulty, weakness and headaches.  Hematological:  Negative for adenopathy. Does not bruise/bleed easily.  Psychiatric/Behavioral: Negative for confusion and sleep disturbance. The patient is not nervous/anxious.     PE; Blood pressure 118/73, pulse (!) 55, temperature 98 F (36.7 C), temperature source Oral, resp. rate 16, height 5' 4.25" (1.632 m), weight 129  lb (58.5 kg), SpO2 100 %. Body mass index is 21.97 kg/m. Pt examined with Helayne Seminole, CMA, as chaperone.  Gen: Alert, well appearing.  Patient is oriented to person, place, time, and situation. AFFECT: pleasant, lucid thought and speech. ENT: Ears: EACs clear, normal epithelium.  TMs with good light reflex and landmarks bilaterally.  Eyes: no injection, icteris, swelling, or exudate.  EOMI, PERRLA. Nose: no drainage or turbinate edema/swelling.  No injection or focal lesion.  Mouth: lips without lesion/swelling.  Oral mucosa pink and moist.  Dentition intact and without obvious caries or gingival swelling.  Oropharynx without erythema, exudate, or swelling.  Neck: supple/nontender.  No LAD, mass, or TM.  Carotid pulses 2+ bilaterally, without bruits. CV: RRR, no m/r/g.   LUNGS: CTA bilat, nonlabored resps, good aeration in all lung fields. ABD: soft, NT, ND, BS normal.  No hepatospenomegaly or mass.  No bruits. EXT: no clubbing, cyanosis, or edema.  Musculoskeletal: no joint swelling, erythema, warmth, or tenderness.  ROM of all joints intact. Skin - no sores or suspicious lesions or rashes or color changes   Pertinent labs:  Lab Results  Component Value Date   TSH 1.94 09/03/2017   Lab Results  Component Value Date   WBC 3.8 (L) 09/03/2017   HGB 13.3 09/03/2017   HCT 39.7 09/03/2017   MCV 86.2 09/03/2017   PLT 195.0 09/03/2017   Lab Results  Component Value Date   CREATININE 0.83 09/03/2017   BUN 18 09/03/2017   NA 136 09/03/2017   K 4.7 09/03/2017   CL 100 09/03/2017   CO2 32 09/03/2017   Lab Results  Component Value Date   ALT 18 09/03/2017   AST 22  09/03/2017   ALKPHOS 73 09/03/2017   BILITOT 0.6 09/03/2017   Lab Results  Component Value Date   CHOL 194 09/03/2017   Lab Results  Component Value Date   HDL 72.70 09/03/2017   Lab Results  Component Value Date   LDLCALC 112 (H) 09/03/2017   Lab Results  Component Value Date   TRIG 48.0 09/03/2017   Lab Results  Component Value Date   CHOLHDL 3 09/03/2017   Lab Results  Component Value Date   HGBA1C 6.0 09/03/2017    ASSESSMENT AND PLAN:   1) Disequilibrium: reassured pt that this sounds benign, although I don't have a good explanation for it. If gets more persistent/severe/frequent then return, esp if associated with other sx's like palpitations, sOB, CP, arm pain, jaw pain, or signif HA.    2) Health maintenance exam: Reviewed age and gender appropriate health maintenance issues (prudent diet, regular exercise, health risks of tobacco and excessive alcohol, use of seatbelts, fire alarms in home, use of sunscreen).  Also reviewed age and gender appropriate health screening as well as vaccine recommendations. Vaccines: needs prevnar 13.  Shjngrix discussed-pt to call periodically to check on availability. Labs: reviewed recent fasting HP labs + Hba1c in detail with pt today--all normal/stable. Cervical ca screening: per our EMR, last one was 10/11/15.  Will get this done 09/2017 at GYN. Breast ca screening: mammo scheduled 09/24/17. DEXA 07/2017-->osteopenia, repeat 2021. Colon ca screening: colonoscopy due this year-->pt to arrange with Dr. Collene Mares.  An After Visit Summary was printed and given to the patient.  FOLLOW UP:  Return in about 1 year (around 09/12/2018) for annual CPE (fasting).  Signed:  Crissie Sickles, MD           09/11/2017

## 2017-09-11 NOTE — Patient Instructions (Signed)

## 2017-10-05 ENCOUNTER — Encounter: Payer: Self-pay | Admitting: Family Medicine

## 2018-01-04 ENCOUNTER — Encounter: Payer: Self-pay | Admitting: Family Medicine

## 2018-01-04 LAB — HM COLONOSCOPY

## 2018-09-01 ENCOUNTER — Encounter: Payer: Self-pay | Admitting: Family Medicine

## 2018-09-01 ENCOUNTER — Ambulatory Visit (INDEPENDENT_AMBULATORY_CARE_PROVIDER_SITE_OTHER): Payer: Managed Care, Other (non HMO) | Admitting: Family Medicine

## 2018-09-01 ENCOUNTER — Other Ambulatory Visit: Payer: Self-pay

## 2018-09-01 VITALS — HR 60 | Temp 98.3°F | Resp 14 | Wt 123.0 lb

## 2018-09-01 DIAGNOSIS — R7301 Impaired fasting glucose: Secondary | ICD-10-CM | POA: Diagnosis not present

## 2018-09-01 DIAGNOSIS — Z0000 Encounter for general adult medical examination without abnormal findings: Secondary | ICD-10-CM

## 2018-09-01 NOTE — Progress Notes (Signed)
Virtual Visit via Video Note  I connected with pt on 09/01/18 at  8:00 AM EDT by a video enabled telemedicine application and verified that I am speaking with the correct person using two identifiers.  Location patient: home Location provider:work or home office Persons participating in the virtual visit: patient, provider  I discussed the limitations of evaluation and management by telemedicine and the availability of in person appointments. The patient expressed understanding and agreed to proceed.  Telemedicine visit is a necessity given the COVID-19 restrictions in place at the current time.  HPI: 68 y/o WF being seen today for annual health maintenance exam.  Staying active, diet is fine.  No acute complaints.  ROS: See pertinent positives and negatives per HPI.  Past Medical History:  Diagnosis Date  . Genital herpes in women 02-17-09   she "plays with" the dose: 1/2-1 tab per day  . Haglund's deformity 10/2015   Right; with posterior calcaneal heel spur (Podiatrist)  . History of fracture of phalanx of thumb 09/2016   [facility]: left thumb, possible impacted fracture of distal portion of proximal phalanx of thumb.  Splint rx'd and ortho f/u advised.  . Impaired fasting glucose 2014   Saw nutritionist (HbA1c 6.1% 02/2014).  A1c 5.7% 08/2015.  A1c 6.0% 08/2017.  . Lactose intolerance    ?  . Multiple allergies 08/02/2010  . Osteopenia 2014   2014 DEXA: Lowest tscore - 1.7 at left femoral neck.  07/2017 T-score -2.0 femur neck  . Tendonitis, Achilles, right 2018   Podiatrist  . Vallecular cyst 04/09/15   ? cause of chronic cough?--Dr. Simeon Craft (ENT) removed this and her cough resolved.    Past Surgical History:  Procedure Laterality Date  . broken leg  6 months old  . cataract surgery  4-12 and 5-12  . COLONOSCOPY  2009; 01/04/18   2009 normal.  Rpt 01/04/18 normal (Dr. Collene Mares).  Recall 10 yrs.  Marland Kitchen COSMETIC SURGERY  68 yrs old   nose  . DEXA  2014; 2019   2014 T-score -1.7.             07/2017 T-score -2.0 femur neck.  . INCISION AND DRAINAGE  08/11/2016   Epidermal inclusion cyst on back (done by MD at [facility] in [place]).  Marland Kitchen teeth pulled  68 yrs old  . Vallecular cyst excision  2017   cough resolved after this was removed.    Family History  Problem Relation Age of Onset  . Heart disease Father        passed after by pass surgery  . Hypertension Father   . Hyperlipidemia Father   . Other Sister        auto immune issues  . Gout Sister   . Autoimmune disease Sister        fatigue, rashes  . Osteopenia Sister   . Breast cancer Sister   . Osteoporosis Mother   Sister: breast ca, age 15  SOCIAL HX:  Social History   Socioeconomic History  . Marital status: Married    Spouse name: Not on file  . Number of children: Not on file  . Years of education: Not on file  . Highest education level: Not on file  Occupational History  . Not on file  Social Needs  . Financial resource strain: Not on file  . Food insecurity    Worry: Not on file    Inability: Not on file  . Transportation needs  Medical: Not on file    Non-medical: Not on file  Tobacco Use  . Smoking status: Never Smoker  . Smokeless tobacco: Never Used  Substance and Sexual Activity  . Alcohol use: No    Comment: 1-2 beers a week or 1-2 glasses of wine a week  . Drug use: No  . Sexual activity: Yes    Partners: Male  Lifestyle  . Physical activity    Days per week: Not on file    Minutes per session: Not on file  . Stress: Not on file  Relationships  . Social Herbalist on phone: Not on file    Gets together: Not on file    Attends religious service: Not on file    Active member of club or organization: Not on file    Attends meetings of clubs or organizations: Not on file    Relationship status: Not on file  Other Topics Concern  . Not on file  Social History Narrative   Married, 2 grown children.   Lives in [place], [place] from [place].    Occupation: Optician, dispensing   No tob, 1-2 beer/wine per week.  No hx of drug abuse or alcohol probs.   Enjoys outdoors, snow skiing, swimming, running, hiking.     Current Outpatient Medications:  .  acyclovir (ZOVIRAX) 400 MG tablet, Take 400 mg by mouth 2 (two) times daily., Disp: , Rfl:  .  Calcium Carbonate-Vitamin D (CALCIUM-VITAMIN D) 500-200 MG-UNIT per tablet, Take 1 tablet by mouth 2 (two) times daily. , Disp: , Rfl:  .  estradiol (ESTRACE) 0.1 MG/GM vaginal cream, Place 1 Applicatorful vaginally once a week. Pt uses every other week., Disp: , Rfl:  .  hydrocortisone 2.5 % cream, APPLY SPARINGLY TO AFFECTED AREA 2 TO 4 TIMES A DAY, Disp: , Rfl: 2 .  multivitamin (THERAGRAN) tablet, Take 1 tablet by mouth daily. One a day, Disp: , Rfl:   EXAM:  VITALS per patient if applicable: Pulse 60   Temp 98.3 F (36.8 C) (Temporal)   Wt 123 lb (55.8 kg)   SpO2 98%   BMI 20.95 kg/m    GENERAL: alert, oriented, appears well and in no acute distress  HEENT: atraumatic, conjunttiva clear, no obvious abnormalities on inspection of external nose and ears  NECK: normal movements of the head and neck  LUNGS: on inspection no signs of respiratory distress, breathing rate appears normal, no obvious gross SOB, gasping or wheezing  CV: no obvious cyanosis  MS: moves all visible extremities without noticeable abnormality  PSYCH/NEURO: pleasant and cooperative, no obvious depression or anxiety, speech and thought processing grossly intact  LABS: none today  Lab Results  Component Value Date   TSH 1.94 09/03/2017   Lab Results  Component Value Date   WBC 3.8 (L) 09/03/2017   HGB 13.3 09/03/2017   HCT 39.7 09/03/2017   MCV 86.2 09/03/2017   PLT 195.0 09/03/2017   Lab Results  Component Value Date   CREATININE 0.83 09/03/2017   BUN 18 09/03/2017   NA 136 09/03/2017   K 4.7 09/03/2017   CL 100 09/03/2017   CO2 32 09/03/2017   Lab Results  Component Value Date   ALT 18  09/03/2017   AST 22 09/03/2017   ALKPHOS 73 09/03/2017   BILITOT 0.6 09/03/2017   Lab Results  Component Value Date   CHOL 194 09/03/2017   Lab Results  Component Value Date   HDL 72.70 09/03/2017  Lab Results  Component Value Date   LDLCALC 112 (H) 09/03/2017   Lab Results  Component Value Date   TRIG 48.0 09/03/2017   Lab Results  Component Value Date   CHOLHDL 3 09/03/2017   Lab Results  Component Value Date   HGBA1C 6.0 09/03/2017    ASSESSMENT AND PLAN:  Discussed the following assessment and plan:  Health maintenance exam: Reviewed age and gender appropriate health maintenance issues (prudent diet, regular exercise, health risks of tobacco and excessive alcohol, use of seatbelts, fire alarms in home, use of sunscreen).  Also reviewed age and gender appropriate health screening as well as vaccine recommendations. Vaccines: UTD. Labs: HP labs ordered--future (including Hba1c for her dx of prediabetes). DEXA: next one due 6 /2021. Cervical ca screening: done annually by her GYN ->has appt 09/2018. Breast ca screening: mammo done by her GYN 09/2017, will get repeat 09/2018. Colon ca screening: next screening colonoscopy due 2029.  I discussed the assessment and treatment plan with the patient. The patient was provided an opportunity to ask questions and all were answered. The patient agreed with the plan and demonstrated an understanding of the instructions.   The patient was advised to call back or seek an in-person evaluation if the symptoms worsen or if the condition fails to improve as anticipated.09/2018  F/u: 1 yr cpe  Signed:  Crissie Sickles, MD           09/01/2018

## 2018-09-15 ENCOUNTER — Encounter: Payer: Self-pay | Admitting: Family Medicine

## 2018-09-15 ENCOUNTER — Ambulatory Visit (INDEPENDENT_AMBULATORY_CARE_PROVIDER_SITE_OTHER): Payer: Managed Care, Other (non HMO) | Admitting: Family Medicine

## 2018-09-15 ENCOUNTER — Other Ambulatory Visit: Payer: Self-pay

## 2018-09-15 VITALS — Temp 97.9°F

## 2018-09-15 DIAGNOSIS — R51 Headache: Secondary | ICD-10-CM

## 2018-09-15 DIAGNOSIS — H9202 Otalgia, left ear: Secondary | ICD-10-CM

## 2018-09-15 DIAGNOSIS — R519 Headache, unspecified: Secondary | ICD-10-CM

## 2018-09-15 MED ORDER — NEOMYCIN-POLYMYXIN-HC 3.5-10000-1 OT SOLN: 4.0000 [drp] | Freq: Three times a day (TID) | OTIC | 0 refills | Status: DC

## 2018-09-15 MED ORDER — AMOXICILLIN-POT CLAVULANATE 875-125 MG PO TABS: 1.0000 | ORAL_TABLET | Freq: Two times a day (BID) | ORAL | 0 refills | Status: DC

## 2018-09-15 NOTE — Progress Notes (Signed)
Virtual Visit via Video Note  I connected with Bonnie [name] on 09/15/18 at  4:00 PM EDT by a video enabled telemedicine application and verified that I am speaking with the correct person using two identifiers.  Location patient: home Location provider:work or home office Persons participating in the virtual visit: patient, provider  I discussed the limitations of evaluation and management by telemedicine and the availability of in person appointments. The patient expressed understanding and agreed to proceed.  Telemedicine visit is a necessity given the COVID-19 restrictions in place at the current time.  HPI: 68 y/o WF being seen today for left sided facial pain.  About 6 wks ago she started having gum sensitivity on upper and lower jaw--seemed to come and go, sometimes maxillary and sometimes mandibular. Then a couple weeks ago felt L ear pain as the L maxillary sinus area pain seemed to be more noticeable. Then 3-4 nights ago felt some worsening L face discomfort on/off, worse at night when she lay on L side of head/face->she gets shooting pain in various areas of left side of face ABOVE mandibular level. She can push on teeth and gums and has no palpable tenderness.  No visible swelling of gums. No URI or allergic rhinitis sx's accompanying the above symptoms, but today has started to have some runny nose with PND.  No nasal congestion. No ST.  No fevers.   Has been swimming regularly lately.  No ear d/c.  Had periodontal appt about 2-3 wks before onset of her sx's and had no problems with teeth/gums at that time..   ROS: no cough, wheeze, sob, or CP.  No dizziness.  No facial numbness/tingling, no weakness of face.  No rash on face or ear.  Past Medical History:  Diagnosis Date  . Genital herpes in women 02-17-09   she "plays with" the dose: 1/2-1 tab per day  . Haglund's deformity 10/2015   Right; with posterior calcaneal heel spur (Podiatrist)  . History of fracture of phalanx of thumb  09/2016   [facility]: left thumb, possible impacted fracture of distal portion of proximal phalanx of thumb.  Splint rx'd and ortho f/u advised.  . Impaired fasting glucose 2014   Saw nutritionist (HbA1c 6.1% 02/2014).  A1c 5.7% 08/2015.  A1c 6.0% 08/2017.  . Lactose intolerance    ?  . Multiple allergies 08/02/2010  . Osteopenia 2014   2014 DEXA: Lowest tscore - 1.7 at left femoral neck.  07/2017 T-score -2.0 femur neck  . Tendonitis, Achilles, right 2018   Podiatrist  . Vallecular cyst 04/09/15   ? cause of chronic cough?--Dr. Simeon Craft (ENT) removed this and her cough resolved.    Past Surgical History:  Procedure Laterality Date  . broken leg  6 months old  . cataract surgery  4-12 and 5-12  . COLONOSCOPY  2009; 01/04/18   2009 normal.  Rpt 01/04/18 normal (Dr. Collene Mares).  Recall 10 yrs.  Marland Kitchen COSMETIC SURGERY  68 yrs old   nose  . DEXA  2014; 2019   2014 T-score -1.7.            07/2017 T-score -2.0 femur neck.  . INCISION AND DRAINAGE  08/11/2016   Epidermal inclusion cyst on back (done by MD at [facility] in [place]).  Marland Kitchen teeth pulled  68 yrs old  . Vallecular cyst excision  2017   cough resolved after this was removed.    Family History  Problem Relation Age of Onset  . Heart disease  Father        passed after by pass surgery  . Hypertension Father   . Hyperlipidemia Father   . Other Sister        auto immune issues  . Gout Sister   . Autoimmune disease Sister        fatigue, rashes  . Osteopenia Sister   . Breast cancer Sister   . Osteoporosis Mother     SOCIAL HX:  Social History   Socioeconomic History  . Marital status: Married    Spouse name: Not on file  . Number of children: Not on file  . Years of education: Not on file  . Highest education level: Not on file  Occupational History  . Not on file  Social Needs  . Financial resource strain: Not on file  . Food insecurity    Worry: Not on file    Inability: Not on file  . Transportation needs     Medical: Not on file    Non-medical: Not on file  Tobacco Use  . Smoking status: Never Smoker  . Smokeless tobacco: Never Used  Substance and Sexual Activity  . Alcohol use: No    Comment: 1-2 beers a week or 1-2 glasses of wine a week  . Drug use: No  . Sexual activity: Yes    Partners: Male  Lifestyle  . Physical activity    Days per week: Not on file    Minutes per session: Not on file  . Stress: Not on file  Relationships  . Social Herbalist on phone: Not on file    Gets together: Not on file    Attends religious service: Not on file    Active member of club or organization: Not on file    Attends meetings of clubs or organizations: Not on file    Relationship status: Not on file  Other Topics Concern  . Not on file  Social History Narrative   Married, 2 grown children.   Lives in [place], [place] from [place].   Occupation: Optician, dispensing   No tob, 1-2 beer/wine per week.  No hx of drug abuse or alcohol probs.   Enjoys outdoors, snow skiing, swimming, running, hiking.      Current Outpatient Medications:  .  acyclovir (ZOVIRAX) 400 MG tablet, Take 400 mg by mouth 2 (two) times daily., Disp: , Rfl:  .  Calcium Carbonate-Vitamin D (CALCIUM-VITAMIN D) 500-200 MG-UNIT per tablet, Take 1 tablet by mouth 2 (two) times daily. , Disp: , Rfl:  .  estradiol (ESTRACE) 0.1 MG/GM vaginal cream, Place 1 Applicatorful vaginally once a week. Bonnie [name] uses every other week., Disp: , Rfl:  .  hydrocortisone 2.5 % cream, APPLY SPARINGLY TO AFFECTED AREA 2 TO 4 TIMES A DAY, Disp: , Rfl: 2 .  multivitamin (THERAGRAN) tablet, Take 1 tablet by mouth daily. One a day, Disp: , Rfl:   EXAM:  VITALS per patient if applicable: Temp 24.2 F (36.6 C) (Oral)    GENERAL: alert, oriented, appears well and in no acute distress  HEENT: atraumatic, conjunttiva clear, no obvious abnormalities on inspection of external nose and ears  NECK: normal movements of the head and neck  LUNGS:  on inspection no signs of respiratory distress, breathing rate appears normal, no obvious gross SOB, gasping or wheezing  CV: no obvious cyanosis  MS: moves all visible extremities without noticeable abnormality  PSYCH/NEURO: pleasant and cooperative, no obvious depression or anxiety, speech and  thought processing grossly intact  LABS: none today    Chemistry      Component Value Date/Time   NA 136 09/03/2017 0802   K 4.7 09/03/2017 0802   CL 100 09/03/2017 0802   CO2 32 09/03/2017 0802   BUN 18 09/03/2017 0802   CREATININE 0.83 09/03/2017 0802      Component Value Date/Time   CALCIUM 9.7 09/03/2017 0802   ALKPHOS 73 09/03/2017 0802   AST 22 09/03/2017 0802   ALT 18 09/03/2017 0802   BILITOT 0.6 09/03/2017 0802     ASSESSMENT AND PLAN:  Discussed the following assessment and plan:  1) left sided facial pain, L ear pain. Question L OM or OE, also potential L maxillary tooth infection.   Low suspicion of bacterial sinusitis. Discussed options with Bonnie [name] and decided to treat empirically with augmentin 875mg  bid x 10d and cortisporin otic in L ear 3-4 gtts tid.   I discussed the assessment and treatment plan with the patient. The patient was provided an opportunity to ask questions and all were answered. The patient agreed with the plan and demonstrated an understanding of the instructions.   The patient was advised to call back or seek an in-person evaluation if the symptoms worsen or if the condition fails to improve as anticipated.  F/u: if not improving after current treatments in 10d or so, or if worsening before that time--needs in-office visit.  Signed:  Crissie Sickles, MD           09/15/2018

## 2018-09-24 ENCOUNTER — Ambulatory Visit: Payer: Managed Care, Other (non HMO) | Admitting: Family Medicine

## 2018-09-24 ENCOUNTER — Encounter: Payer: Self-pay | Admitting: Family Medicine

## 2018-09-24 ENCOUNTER — Other Ambulatory Visit: Payer: Self-pay

## 2018-09-24 VITALS — BP 132/69 | HR 57 | Temp 97.9°F | Resp 16 | Ht 64.25 in | Wt 123.4 lb

## 2018-09-24 DIAGNOSIS — R51 Headache: Secondary | ICD-10-CM | POA: Diagnosis not present

## 2018-09-24 DIAGNOSIS — R519 Headache, unspecified: Secondary | ICD-10-CM

## 2018-09-24 NOTE — Patient Instructions (Signed)
Dr. Tana Felts [facility] [phone];

## 2018-09-24 NOTE — Progress Notes (Signed)
OFFICE VISIT  09/24/2018   CC:  Chief Complaint  Patient presents with  . Follow-up    L sided facial pain   HPI:    Patient is a 68 y.o. Caucasian female who presents for f/u L sided facial pain. I saw her in a telemedicine visit for this problem 9 days ago. We ended up deciding on a trial of oral antibiotic and antibiotic drops for potential L AOM and L AOE. Trigeminal neuralgia certainly a possibility, though.  Interim hx:  Noted MUCH improvement the day after starting the meds. She is approx 90-95% improved now.  Some occ nasal congestion.   Past Medical History:  Diagnosis Date  . Genital herpes in women 02-17-09   she "plays with" the dose: 1/2-1 tab per day  . Haglund's deformity 10/2015   Right; with posterior calcaneal heel spur (Podiatrist)  . History of fracture of phalanx of thumb 09/2016   [facility]: left thumb, possible impacted fracture of distal portion of proximal phalanx of thumb.  Splint rx'd and ortho f/u advised.  . Impaired fasting glucose 2014   Saw nutritionist (HbA1c 6.1% 02/2014).  A1c 5.7% 08/2015.  A1c 6.0% 08/2017.  . Lactose intolerance    ?  . Multiple allergies 08/02/2010  . Osteopenia 2014   2014 DEXA: Lowest tscore - 1.7 at left femoral neck.  07/2017 T-score -2.0 femur neck  . Tendonitis, Achilles, right 2018   Podiatrist  . Vallecular cyst 04/09/15   ? cause of chronic cough?--Dr. Simeon Craft (ENT) removed this and her cough resolved.    Past Surgical History:  Procedure Laterality Date  . broken leg  6 months old  . cataract surgery  4-12 and 5-12  . COLONOSCOPY  2009; 01/04/18   2009 normal.  Rpt 01/04/18 normal (Dr. Collene Mares).  Recall 10 yrs.  Marland Kitchen COSMETIC SURGERY  68 yrs old   nose  . DEXA  2014; 2019   2014 T-score -1.7.            07/2017 T-score -2.0 femur neck.  . INCISION AND DRAINAGE  08/11/2016   Epidermal inclusion cyst on back (done by MD at [facility] in [place]).  Marland Kitchen teeth pulled  68 yrs old  . Vallecular cyst  excision  2017   cough resolved after this was removed.    Outpatient Medications Prior to Visit  Medication Sig Dispense Refill  . acyclovir (ZOVIRAX) 400 MG tablet Take 400 mg by mouth 2 (two) times daily.    Marland Kitchen amoxicillin-clavulanate (AUGMENTIN) 875-125 MG tablet Take 1 tablet by mouth 2 (two) times daily. 20 tablet 0  . Calcium Carbonate-Vitamin D (CALCIUM-VITAMIN D) 500-200 MG-UNIT per tablet Take 1 tablet by mouth 2 (two) times daily.     Marland Kitchen estradiol (ESTRACE) 0.1 MG/GM vaginal cream Place 1 Applicatorful vaginally once a week. Pt uses every other week.    . multivitamin (THERAGRAN) tablet Take 1 tablet by mouth daily. One a day    . neomycin-polymyxin-hydrocortisone (CORTISPORIN) OTIC solution Place 4 drops into the left ear 3 (three) times daily. 10 mL 0  . Tea Tree Oil OIL by Does not apply route 2 (two) times daily.    . hydrocortisone 2.5 % cream APPLY SPARINGLY TO AFFECTED AREA 2 TO 4 TIMES A DAY  2   No facility-administered medications prior to visit.     Allergies  Allergen Reactions  . Cefaclor Rash    ROS As per HPI  PE: Blood pressure 132/69, pulse (!) 57,  temperature 97.9 F (36.6 C), temperature source Temporal, resp. rate 16, height 5' 4.25" (1.632 m), weight 123 lb 6.4 oz (56 kg), SpO2 100 %. Gen: Alert, well appearing.  Patient is oriented to person, place, time, and situation. ENT: Ears: EACs clear, normal epithelium.  TMs with good light reflex and landmarks bilaterally.  Eyes: no injection, icteris, swelling, or exudate.  EOMI, PERRLA. Nose: no drainage or turbinate edema/swelling.  No injection or focal lesion.  Mouth: lips without lesion/swelling.  Oral mucosa pink and moist.  Dentition intact and without obvious caries or gingival swelling.  Oropharynx without erythema, exudate, or swelling. No signif TTP over face or ears.   LABS:    Chemistry      Component Value Date/Time   NA 136 09/03/2017 0802   K 4.7 09/03/2017 0802   CL 100 09/03/2017 0802    CO2 32 09/03/2017 0802   BUN 18 09/03/2017 0802   CREATININE 0.83 09/03/2017 0802      Component Value Date/Time   CALCIUM 9.7 09/03/2017 0802   ALKPHOS 73 09/03/2017 0802   AST 22 09/03/2017 0802   ALT 18 09/03/2017 0802   BILITOT 0.6 09/03/2017 0802       IMPRESSION AND PLAN:  Left ear/facial pain: the description of it sounds a lot like trigeminal neuralgia but her dramatic improvement with oral abx and antibiotic ear drops is great to see. Reassured. She'll call or return if sx's return.  An After Visit Summary was printed and given to the patient.  FOLLOW UP: Return if symptoms worsen or fail to improve.  Signed:  Crissie Sickles, MD           09/24/2018

## 2018-10-07 ENCOUNTER — Ambulatory Visit: Payer: Managed Care, Other (non HMO) | Admitting: Family Medicine

## 2018-10-07 ENCOUNTER — Other Ambulatory Visit: Payer: Self-pay | Admitting: Family Medicine

## 2018-10-07 ENCOUNTER — Other Ambulatory Visit: Payer: Self-pay

## 2018-10-07 DIAGNOSIS — Z0000 Encounter for general adult medical examination without abnormal findings: Secondary | ICD-10-CM

## 2018-10-07 DIAGNOSIS — R7301 Impaired fasting glucose: Secondary | ICD-10-CM

## 2018-10-07 NOTE — Progress Notes (Signed)
Patient did not fast, appt rescheduled.

## 2018-10-12 ENCOUNTER — Ambulatory Visit (INDEPENDENT_AMBULATORY_CARE_PROVIDER_SITE_OTHER): Payer: Managed Care, Other (non HMO) | Admitting: Family Medicine

## 2018-10-12 ENCOUNTER — Other Ambulatory Visit: Payer: Self-pay

## 2018-10-12 DIAGNOSIS — R7301 Impaired fasting glucose: Secondary | ICD-10-CM | POA: Diagnosis not present

## 2018-10-12 DIAGNOSIS — Z0000 Encounter for general adult medical examination without abnormal findings: Secondary | ICD-10-CM

## 2018-10-12 LAB — CBC WITH DIFFERENTIAL/PLATELET
Basophils Absolute: 0 10*3/uL (ref 0.0–0.1)
Basophils Relative: 0.6 % (ref 0.0–3.0)
Eosinophils Absolute: 0.2 10*3/uL (ref 0.0–0.7)
Eosinophils Relative: 5.6 % — ABNORMAL HIGH (ref 0.0–5.0)
HCT: 38.4 % (ref 36.0–46.0)
Hemoglobin: 12.6 g/dL (ref 12.0–15.0)
Lymphocytes Relative: 35.8 % (ref 12.0–46.0)
Lymphs Abs: 1.5 10*3/uL (ref 0.7–4.0)
MCHC: 32.8 g/dL (ref 30.0–36.0)
MCV: 86.1 fl (ref 78.0–100.0)
Monocytes Absolute: 0.4 10*3/uL (ref 0.1–1.0)
Monocytes Relative: 9 % (ref 3.0–12.0)
Neutro Abs: 2.1 10*3/uL (ref 1.4–7.7)
Neutrophils Relative %: 49 % (ref 43.0–77.0)
Platelets: 181 10*3/uL (ref 150.0–400.0)
RBC: 4.46 Mil/uL (ref 3.87–5.11)
RDW: 13.2 % (ref 11.5–15.5)
WBC: 4.3 10*3/uL (ref 4.0–10.5)

## 2018-10-12 LAB — LIPID PANEL
Cholesterol: 195 mg/dL (ref 0–200)
HDL: 65.6 mg/dL (ref >39.00)
LDL Cholesterol: 119 mg/dL — ABNORMAL HIGH (ref 0–99)
NonHDL: 129.05
Total CHOL/HDL Ratio: 3
Triglycerides: 50 mg/dL (ref 0.0–149.0)
VLDL: 10 mg/dL (ref 0.0–40.0)

## 2018-10-12 LAB — COMPREHENSIVE METABOLIC PANEL
ALT: 14 U/L (ref 0–35)
AST: 20 U/L (ref 0–37)
Albumin: 4.4 g/dL (ref 3.5–5.2)
Alkaline Phosphatase: 70 U/L (ref 39–117)
BUN: 17 mg/dL (ref 6–23)
CO2: 29 mEq/L (ref 19–32)
Calcium: 9.3 mg/dL (ref 8.4–10.5)
Chloride: 101 mEq/L (ref 96–112)
Creatinine, Ser: 0.67 mg/dL (ref 0.40–1.20)
GFR: 88.03 mL/min (ref >60.00)
Glucose, Bld: 92 mg/dL (ref 70–99)
Potassium: 4.3 mEq/L (ref 3.5–5.1)
Sodium: 135 mEq/L (ref 135–145)
Total Bilirubin: 0.5 mg/dL (ref 0.2–1.2)
Total Protein: 6.6 g/dL (ref 6.0–8.3)

## 2018-10-12 LAB — TSH: TSH: 1.23 u[IU]/mL (ref 0.35–4.50)

## 2018-10-12 LAB — HEMOGLOBIN A1C: Hgb A1c MFr Bld: 6.1 % (ref 4.6–6.5)

## 2018-10-13 ENCOUNTER — Encounter: Payer: Self-pay | Admitting: Family Medicine

## 2019-05-04 ENCOUNTER — Other Ambulatory Visit: Payer: Self-pay

## 2019-05-04 ENCOUNTER — Encounter: Payer: Self-pay | Admitting: Family Medicine

## 2019-05-04 ENCOUNTER — Ambulatory Visit: Payer: Managed Care, Other (non HMO) | Admitting: Family Medicine

## 2019-05-04 VITALS — BP 132/81 | HR 74 | Temp 98.0°F | Resp 16 | Ht 64.25 in | Wt 132.2 lb

## 2019-05-04 DIAGNOSIS — H0011 Chalazion right upper eyelid: Secondary | ICD-10-CM

## 2019-05-04 NOTE — Progress Notes (Signed)
OFFICE VISIT  05/04/2019   CC:  Chief Complaint  Patient presents with  . Swollen eyelid   HPI:    Patient is a 69 y.o. Caucasian female who presents for an eye complaint. Onset of redness and swelling of R upper eyelid 4 nights ago. She rubbed it some. Gradually worsening.  No pain.  No itching or other sx's. She put put eye lubricant and put drops in last night. Vision is normal.    Past Medical History:  Diagnosis Date  . Genital herpes in women 02-17-09   she "plays with" the dose: 1/2-1 tab per day  . Haglund's deformity 10/2015   Right; with posterior calcaneal heel spur (Podiatrist)  . History of fracture of phalanx of thumb 09/2016   [facility]: left thumb, possible impacted fracture of distal portion of proximal phalanx of thumb.  Splint rx'd and ortho f/u advised.  . Lactose intolerance    ?  . Multiple allergies 08/02/2010  . Osteopenia 2014   2014 DEXA: Lowest tscore - 1.7 at left femoral neck.  07/2017 T-score -2.0 femur neck  . Prediabetes 2014   Saw nutritionist (HbA1c 6.1% 02/2014).  A1c 5.7% 08/2015.  A1c 6.0% 08/2017. A1c 6.1% 09/2018.  Marland Kitchen Tendonitis, Achilles, right 2018   Podiatrist  . Vallecular cyst 04/09/15   ? cause of chronic cough?--Dr. Simeon Craft (ENT) removed this and her cough resolved.    Past Surgical History:  Procedure Laterality Date  . broken leg  6 months old  . cataract surgery  4-12 and 5-12  . COLONOSCOPY  2009; 01/04/18   2009 normal.  Rpt 01/04/18 normal (Dr. Collene Mares).  Recall 10 yrs.  Marland Kitchen COSMETIC SURGERY  69 yrs old   nose  . DEXA  2014; 2019   2014 T-score -1.7.            07/2017 T-score -2.0 femur neck.  . INCISION AND DRAINAGE  08/11/2016   Epidermal inclusion cyst on back (done by MD at [facility] in [place]).  Marland Kitchen teeth pulled  69 yrs old  . Vallecular cyst excision  2017   cough resolved after this was removed.    Outpatient Medications Prior to Visit  Medication Sig Dispense Refill  . acyclovir (ZOVIRAX) 400 MG  tablet Take 400 mg by mouth 2 (two) times daily.    . Calcium Carbonate-Vitamin D (CALCIUM-VITAMIN D) 500-200 MG-UNIT per tablet Take 1 tablet by mouth 2 (two) times daily.     Marland Kitchen estradiol (ESTRACE) 0.1 MG/GM vaginal cream Place 1 Applicatorful vaginally once a week. Pt uses every other week.    . hydrocortisone 2.5 % cream APPLY SPARINGLY TO AFFECTED AREA 2 TO 4 TIMES A DAY  2  . multivitamin (THERAGRAN) tablet Take 1 tablet by mouth daily. One a day    . Tea Tree Oil OIL by Does not apply route 2 (two) times daily.    Marland Kitchen amoxicillin-clavulanate (AUGMENTIN) 875-125 MG tablet Take 1 tablet by mouth 2 (two) times daily. (Patient not taking: Reported on 05/04/2019) 20 tablet 0  . neomycin-polymyxin-hydrocortisone (CORTISPORIN) OTIC solution Place 4 drops into the left ear 3 (three) times daily. (Patient not taking: Reported on 05/04/2019) 10 mL 0   No facility-administered medications prior to visit.    Allergies  Allergen Reactions  . Cefaclor Rash    ROS As per HPI  PE: Blood pressure 132/81, pulse 74, temperature 98 F (36.7 C), temperature source Temporal, resp. rate 16, height 5' 4.25" (1.632 m), weight 132  lb 3.2 oz (60 kg), SpO2 100 %. Gen: Alert, well appearing.  Patient is oriented to person, place, time, and situation. AFFECT: pleasant, lucid thought and speech. EOMI, PERRL Mild erythema and swelling of R upper eyelid, with small, barely palpable focal nodule on this lid, superior to and NOT INVOLVING eye lash line.  Mildly TTP over focal nodular aspect of upper eyelid.  Bulbar conjunctiva w/out injection. No eye exudate.  LABS:    Chemistry      Component Value Date/Time   NA 135 10/12/2018 0937   K 4.3 10/12/2018 0937   CL 101 10/12/2018 0937   CO2 29 10/12/2018 0937   BUN 17 10/12/2018 0937   CREATININE 0.67 10/12/2018 0937      Component Value Date/Time   CALCIUM 9.3 10/12/2018 0937   ALKPHOS 70 10/12/2018 0937   AST 20 10/12/2018 0937   ALT 14 10/12/2018 0937    BILITOT 0.5 10/12/2018 0937      IMPRESSION AND PLAN:  Right upper eyelid chalazion.  No sign of infection. Instructions: Wynetta Emery and Wynetta Emery baby shampoo soaks 20 min three times per day.  Chalazion patient education handout reviewed with pt and given to her to take home.  An After Visit Summary was printed and given to the patient.  FOLLOW UP: Return if symptoms worsen or fail to improve.  Signed:  Crissie Sickles, MD           05/04/2019

## 2019-05-04 NOTE — Patient Instructions (Addendum)
[employer] baby shampoo soaks 20 min three times per day.   Chalazion  A chalazion is a swelling or lump on the eyelid. It can affect the upper eyelid or the lower eyelid. What are the causes? This condition may be caused by:  Long-lasting (chronic) inflammation of the eyelid glands.  A blocked oil gland in the eyelid. What are the signs or symptoms? Symptoms of this condition include:  Swelling of the eyelid. The swelling may spread to areas around the eye.  A hard lump on the eyelid.  Blurry vision. The lump on the eyelid may make it hard to see out of the eye. How is this diagnosed? This condition is diagnosed with an examination of the eye. How is this treated? This condition is treated by applying a warm compress to the eyelid. If the condition does not improve, it may be treated with:  Medicine that is injected into the chalazion by a health care provider.  Surgery.  Medicine that is applied to the eye. Follow these instructions at home: Managing pain and swelling  Apply a warm, moist compress to the eyelid 4-6 times a day for 10-15 minutes at a time. This will help to open any blocked glands and to reduce redness and swelling.  Apply over-the-counter and prescription medicines only as told by your health care provider. General instructions  Do not touch the chalazion.  Do not try to remove the pus. Do not squeeze the chalazion or stick it with a pin or needle.  Do not rub your eyes.  Wash your hands often. Dry your hands with a clean towel.  Keep your face, scalp, and eyebrows clean.  Avoid wearing eye makeup.  If the chalazion does not break open (rupture) on its own, return to your health care provider.  Keep all follow-up appointments as told by your health care provider. This is important. Contact a health care provider if:  Your eyelid has not improved in 4 weeks.  Your eyelid is getting worse.  You have a fever.  The chalazion does  not rupture on its own after a month of home treatment. Get help right away if:  You have pain in your eye.  Your vision changes.  The chalazion becomes painful or red.  The chalazion gets bigger. Summary  A chalazion is a swelling or lump on the upper or lower eyelid.  It may be caused by chronic inflammation or a blocked oil gland.  Apply a warm, moist compress to the eyelid 4-6 times a day for 10-15 minutes at a time.  Keep your face, scalp, and eyebrows clean. This information is not intended to replace advice given to you by your health care provider. Make sure you discuss any questions you have with your health care provider. Document Revised: 07/23/2017 Document Reviewed: 07/23/2017 Elsevier Patient Education  [place].

## 2019-07-06 ENCOUNTER — Encounter: Payer: Self-pay | Admitting: Podiatry

## 2019-07-06 ENCOUNTER — Other Ambulatory Visit: Payer: Self-pay

## 2019-07-06 ENCOUNTER — Ambulatory Visit: Payer: Managed Care, Other (non HMO) | Admitting: Podiatry

## 2019-07-06 ENCOUNTER — Other Ambulatory Visit: Payer: Self-pay | Admitting: Podiatry

## 2019-07-06 ENCOUNTER — Ambulatory Visit (INDEPENDENT_AMBULATORY_CARE_PROVIDER_SITE_OTHER): Payer: Managed Care, Other (non HMO)

## 2019-07-06 DIAGNOSIS — M7661 Achilles tendinitis, right leg: Secondary | ICD-10-CM

## 2019-07-06 DIAGNOSIS — M79672 Pain in left foot: Secondary | ICD-10-CM | POA: Diagnosis not present

## 2019-07-06 DIAGNOSIS — M7752 Other enthesopathy of left foot: Secondary | ICD-10-CM | POA: Diagnosis not present

## 2019-07-06 DIAGNOSIS — M7662 Achilles tendinitis, left leg: Secondary | ICD-10-CM

## 2019-07-06 DIAGNOSIS — M7750 Other enthesopathy of unspecified foot: Secondary | ICD-10-CM

## 2019-07-06 DIAGNOSIS — M7751 Other enthesopathy of right foot: Secondary | ICD-10-CM | POA: Diagnosis not present

## 2019-07-06 DIAGNOSIS — M79671 Pain in right foot: Secondary | ICD-10-CM

## 2019-07-06 NOTE — Progress Notes (Signed)
Subjective:   Patient ID: Bonnie [name], female   DOB: 69 y.o.   MRN: [id number]   HPI Patient states that she has had problems with the Achilles tendon right over left and states it is stopping her from doing the running she likes or if she does that she is get a pain with pain for at least several days.  Has tried orthotics and some bracing without relief of symptoms and patient does not smoke likes to be active   Review of Systems  All other systems reviewed and are negative.       Objective:  Physical Exam Vitals and nursing note reviewed.  Constitutional:      Appearance: She is well-developed.  Pulmonary:     Effort: Pulmonary effort is normal.  Musculoskeletal:        General: Normal range of motion.  Skin:    General: Skin is warm.  Neurological:     Mental Status: She is alert.     Neurovascular status intact muscle strength found to be adequate range of motion within normal limits with exquisite discomfort lateral side right posterior heel at the insertion of the Achilles.  I did not note equinus condition and the left did not exhibit the same inflammation or pain as the right 1     Assessment:  Acute Achilles tendinitis right with chronic nature also to the condition     Plan:  H&P x-rays reviewed discussing large spur.  I recommended an aggressive conservative approach to get it better and I have recommended injection explaining chances for rupture but I want to immobilize it after the procedure.  She wants to go this route understanding it may also do a modified Achilles tendon procedure if symptoms persist.  I did a sterile prep and carefully injected the lateral side of the posterior Achilles tendon insertion 3 mg dexamethasone 5 mg Xylocaine and applied air fracture walker to completely immobilize and reappoint to recheck again in the next several months  X-rays indicate significant spur formation bilateral.

## 2019-07-06 NOTE — Patient Instructions (Signed)

## 2019-07-08 ENCOUNTER — Telehealth: Payer: Self-pay | Admitting: *Deleted

## 2019-07-08 NOTE — Telephone Encounter (Signed)
Left message advising pt against long walk, that she needed to treat herself like an athlete and rest, I would ask Dr. Paulla Dolly on his return for more information.

## 2019-07-08 NOTE — Telephone Encounter (Signed)
Patient is wanting to know if she should be taking long  Distance walks,walking on paved surfaces and could she go on a trail since she was told per Dr Paulla Dolly that she could do long walks this weekend. Please call.

## 2019-07-11 NOTE — Telephone Encounter (Signed)
She can walk on trails

## 2019-07-11 NOTE — Telephone Encounter (Signed)
Left message informing pt of Dr. Mellody Drown recommendations.

## 2019-07-13 ENCOUNTER — Telehealth: Payer: Self-pay

## 2019-07-13 DIAGNOSIS — Z0000 Encounter for general adult medical examination without abnormal findings: Secondary | ICD-10-CM

## 2019-07-13 DIAGNOSIS — R7301 Impaired fasting glucose: Secondary | ICD-10-CM

## 2019-07-13 NOTE — Telephone Encounter (Signed)
Patient can be scheduled for labs 1 week before CPE appt on 7/30

## 2019-07-13 NOTE — Telephone Encounter (Signed)
Patient request to come in 1 week prior for fasting labs prior to CPE. Can Dr. Anitra Lauth please order labs?    CPE is scheduled on 09-16-2019    Patient needs to be called to set up Nurse Appt when labs have been ordered.

## 2019-07-13 NOTE — Telephone Encounter (Signed)
Yes,ok, labs ordered.

## 2019-07-13 NOTE — Telephone Encounter (Signed)
Please advise, thanks.

## 2019-07-14 NOTE — Telephone Encounter (Signed)
Patient called and scheduled on 7/23

## 2019-09-06 ENCOUNTER — Other Ambulatory Visit: Payer: Self-pay

## 2019-09-06 ENCOUNTER — Ambulatory Visit (INDEPENDENT_AMBULATORY_CARE_PROVIDER_SITE_OTHER): Payer: Managed Care, Other (non HMO) | Admitting: Family Medicine

## 2019-09-06 DIAGNOSIS — R7301 Impaired fasting glucose: Secondary | ICD-10-CM | POA: Diagnosis not present

## 2019-09-06 LAB — COMPREHENSIVE METABOLIC PANEL
ALT: 14 U/L (ref 0–35)
AST: 18 U/L (ref 0–37)
Albumin: 4.2 g/dL (ref 3.5–5.2)
Alkaline Phosphatase: 70 U/L (ref 39–117)
BUN: 17 mg/dL (ref 6–23)
CO2: 29 mEq/L (ref 19–32)
Calcium: 9.5 mg/dL (ref 8.4–10.5)
Chloride: 100 mEq/L (ref 96–112)
Creatinine, Ser: 0.79 mg/dL (ref 0.40–1.20)
GFR: 72.59 mL/min (ref >60.00)
Glucose, Bld: 90 mg/dL (ref 70–99)
Potassium: 4.2 mEq/L (ref 3.5–5.1)
Sodium: 135 mEq/L (ref 135–145)
Total Bilirubin: 0.5 mg/dL (ref 0.2–1.2)
Total Protein: 6.6 g/dL (ref 6.0–8.3)

## 2019-09-06 LAB — CBC WITH DIFFERENTIAL/PLATELET
Basophils Absolute: 0.1 10*3/uL (ref 0.0–0.1)
Basophils Relative: 2.6 % (ref 0.0–3.0)
Eosinophils Absolute: 0.1 10*3/uL (ref 0.0–0.7)
Eosinophils Relative: 1.9 % (ref 0.0–5.0)
HCT: 38.2 % (ref 36.0–46.0)
Hemoglobin: 12.8 g/dL (ref 12.0–15.0)
Lymphocytes Relative: 33.8 % (ref 12.0–46.0)
Lymphs Abs: 1.6 10*3/uL (ref 0.7–4.0)
MCHC: 33.5 g/dL (ref 30.0–36.0)
MCV: 85.1 fl (ref 78.0–100.0)
Monocytes Absolute: 0.4 10*3/uL (ref 0.1–1.0)
Monocytes Relative: 8.7 % (ref 3.0–12.0)
Neutro Abs: 2.5 10*3/uL (ref 1.4–7.7)
Neutrophils Relative %: 53 % (ref 43.0–77.0)
Platelets: 189 10*3/uL (ref 150.0–400.0)
RBC: 4.49 Mil/uL (ref 3.87–5.11)
RDW: 13.5 % (ref 11.5–15.5)
WBC: 4.8 10*3/uL (ref 4.0–10.5)

## 2019-09-06 LAB — LIPID PANEL
Cholesterol: 184 mg/dL (ref 0–200)
HDL: 67.5 mg/dL (ref >39.00)
LDL Cholesterol: 102 mg/dL — ABNORMAL HIGH (ref 0–99)
NonHDL: 116.23
Total CHOL/HDL Ratio: 3
Triglycerides: 72 mg/dL (ref 0.0–149.0)
VLDL: 14.4 mg/dL (ref 0.0–40.0)

## 2019-09-06 LAB — HEMOGLOBIN A1C: Hgb A1c MFr Bld: 6 % (ref 4.6–6.5)

## 2019-09-06 LAB — TSH: TSH: 1.95 u[IU]/mL (ref 0.35–4.50)

## 2019-09-09 ENCOUNTER — Ambulatory Visit: Payer: Managed Care, Other (non HMO)

## 2019-09-16 ENCOUNTER — Ambulatory Visit (INDEPENDENT_AMBULATORY_CARE_PROVIDER_SITE_OTHER): Payer: Managed Care, Other (non HMO) | Admitting: Family Medicine

## 2019-09-16 ENCOUNTER — Encounter: Payer: Self-pay | Admitting: Family Medicine

## 2019-09-16 ENCOUNTER — Other Ambulatory Visit: Payer: Self-pay

## 2019-09-16 VITALS — BP 127/82 | HR 74 | Temp 97.7°F | Resp 16 | Ht 64.0 in | Wt 127.0 lb

## 2019-09-16 DIAGNOSIS — Z23 Encounter for immunization: Secondary | ICD-10-CM | POA: Diagnosis not present

## 2019-09-16 DIAGNOSIS — E2839 Other primary ovarian failure: Secondary | ICD-10-CM

## 2019-09-16 DIAGNOSIS — M8580 Other specified disorders of bone density and structure, unspecified site: Secondary | ICD-10-CM | POA: Diagnosis not present

## 2019-09-16 DIAGNOSIS — Z0000 Encounter for general adult medical examination without abnormal findings: Secondary | ICD-10-CM | POA: Diagnosis not present

## 2019-09-16 DIAGNOSIS — Z13820 Encounter for screening for osteoporosis: Secondary | ICD-10-CM | POA: Diagnosis not present

## 2019-09-16 NOTE — Addendum Note (Signed)
Addended by: Deveron Furlong D on: 09/16/2019 08:43 AM   Modules accepted: Orders

## 2019-09-16 NOTE — Patient Instructions (Signed)
Health Maintenance, Female Adopting a healthy lifestyle and getting preventive care are important in promoting health and wellness. Ask your health care provider about:  The right schedule for you to have regular tests and exams.  Things you can do on your own to prevent diseases and keep yourself healthy. What should I know about diet, weight, and exercise? Eat a healthy diet   Eat a diet that includes plenty of vegetables, fruits, low-fat dairy products, and lean protein.  Do not eat a lot of foods that are high in solid fats, added sugars, or sodium. Maintain a healthy weight Body mass index (BMI) is used to identify weight problems. It estimates body fat based on height and weight. Your health care provider can help determine your BMI and help you achieve or maintain a healthy weight. Get regular exercise Get regular exercise. This is one of the most important things you can do for your health. Most adults should:  Exercise for at least 150 minutes each week. The exercise should increase your heart rate and make you sweat (moderate-intensity exercise).  Do strengthening exercises at least twice a week. This is in addition to the moderate-intensity exercise.  Spend less time sitting. Even light physical activity can be beneficial. Watch cholesterol and blood lipids Have your blood tested for lipids and cholesterol at 67 years of age, then have this test every 5 years. Have your cholesterol levels checked more often if:  Your lipid or cholesterol levels are high.  You are older than 67 years of age.  You are at high risk for heart disease. What should I know about cancer screening? Depending on your health history and family history, you may need to have cancer screening at various ages. This may include screening for:  Breast cancer.  Cervical cancer.  Colorectal cancer.  Skin cancer.  Lung cancer. What should I know about heart disease, diabetes, and high blood  pressure? Blood pressure and heart disease  High blood pressure causes heart disease and increases the risk of stroke. This is more likely to develop in people who have high blood pressure readings, are of African descent, or are overweight.  Have your blood pressure checked: ? Every 3-5 years if you are 18-39 years of age. ? Every year if you are 40 years old or older. Diabetes Have regular diabetes screenings. This checks your fasting blood sugar level. Have the screening done:  Once every three years after age 40 if you are at a normal weight and have a low risk for diabetes.  More often and at a younger age if you are overweight or have a high risk for diabetes. What should I know about preventing infection? Hepatitis B If you have a higher risk for hepatitis B, you should be screened for this virus. Talk with your health care provider to find out if you are at risk for hepatitis B infection. Hepatitis C Testing is recommended for:  Everyone born from 1945 through 1965.  Anyone with known risk factors for hepatitis C. Sexually transmitted infections (STIs)  Get screened for STIs, including gonorrhea and chlamydia, if: ? You are sexually active and are younger than 67 years of age. ? You are older than 67 years of age and your health care provider tells you that you are at risk for this type of infection. ? Your sexual activity has changed since you were last screened, and you are at increased risk for chlamydia or gonorrhea. Ask your health care provider if   you are at risk.  Ask your health care provider about whether you are at high risk for HIV. Your health care provider may recommend a prescription medicine to help prevent HIV infection. If you choose to take medicine to prevent HIV, you should first get tested for HIV. You should then be tested every 3 months for as long as you are taking the medicine. Pregnancy  If you are about to stop having your period (premenopausal) and  you may become pregnant, seek counseling before you get pregnant.  Take 400 to 800 micrograms (mcg) of folic acid every day if you become pregnant.  Ask for birth control (contraception) if you want to prevent pregnancy. Osteoporosis and menopause Osteoporosis is a disease in which the bones lose minerals and strength with aging. This can result in bone fractures. If you are 65 years old or older, or if you are at risk for osteoporosis and fractures, ask your health care provider if you should:  Be screened for bone loss.  Take a calcium or vitamin D supplement to lower your risk of fractures.  Be given hormone replacement therapy (HRT) to treat symptoms of menopause. Follow these instructions at home: Lifestyle  Do not use any products that contain nicotine or tobacco, such as cigarettes, e-cigarettes, and chewing tobacco. If you need help quitting, ask your health care provider.  Do not use street drugs.  Do not share needles.  Ask your health care provider for help if you need support or information about quitting drugs. Alcohol use  Do not drink alcohol if: ? Your health care provider tells you not to drink. ? You are pregnant, may be pregnant, or are planning to become pregnant.  If you drink alcohol: ? Limit how much you use to 0-1 drink a day. ? Limit intake if you are breastfeeding.  Be aware of how much alcohol is in your drink. In the [place] one drink equals one 12 oz bottle of beer (355 mL), one 5 oz glass of wine (148 mL), or one 1 oz glass of hard liquor (44 mL). General instructions  Schedule regular health, dental, and eye exams.  Stay current with your vaccines.  Tell your health care provider if: ? You often feel depressed. ? You have ever been abused or do not feel safe at home. Summary  Adopting a healthy lifestyle and getting preventive care are important in promoting health and wellness.  Follow your health care provider's instructions about healthy  diet, exercising, and getting tested or screened for diseases.  Follow your health care provider's instructions on monitoring your cholesterol and blood pressure. This information is not intended to replace advice given to you by your health care provider. Make sure you discuss any questions you have with your health care provider. Document Revised: 01/27/2018 Document Reviewed: 01/27/2018 Elsevier Patient Education  2020 Elsevier Inc.  

## 2019-09-16 NOTE — Progress Notes (Signed)
Office Note 09/16/2019  CC:  Chief Complaint  Patient presents with  . Annual Exam    pt is not fasting    HPI:  Bonnie [name] is a 69 y.o. White female who is here for annual health maintenance exam. Still running lots for exercise. Diet is good. Recent URI sxs, got covid test 3 d/a -->neg.   Past Medical History:  Diagnosis Date  . Genital herpes in women 02-17-09   she "plays with" the dose: 1/2-1 tab per day  . Haglund's deformity 10/2015   Right; with posterior calcaneal heel spur (Podiatrist)  . History of fracture of phalanx of thumb 09/2016   [facility]: left thumb, possible impacted fracture of distal portion of proximal phalanx of thumb.  Splint rx'd and ortho f/u advised.  . Lactose intolerance    ?  . Multiple allergies 08/02/2010  . Osteopenia 2014   2014 DEXA: Lowest tscore - 1.7 at left femoral neck.  07/2017 T-score -2.0 femur neck  . Prediabetes 2014   Saw nutritionist (HbA1c 6.1% 02/2014).  A1c 5.7% 08/2015.  A1c 6.0% 08/2017. A1c 6.1% 09/2018.  Marland Kitchen Tendonitis, Achilles, right 2018   Podiatrist  . Vallecular cyst 04/09/15   ? cause of chronic cough?--Dr. Simeon Craft (ENT) removed this and her cough resolved.    Past Surgical History:  Procedure Laterality Date  . broken leg  6 months old  . cataract surgery  4-12 and 5-12  . COLONOSCOPY  2009; 01/04/18   2009 normal.  Rpt 01/04/18 normal (Dr. Collene Mares).  Recall 10 yrs.  Marland Kitchen COSMETIC SURGERY  69 yrs old   nose  . DEXA  2014; 2019   2014 T-score -1.7.            07/2017 T-score -2.0 femur neck.  . INCISION AND DRAINAGE  08/11/2016   Epidermal inclusion cyst on back (done by MD at [facility] in [place]).  Marland Kitchen teeth pulled  69 yrs old  . Vallecular cyst excision  2017   cough resolved after this was removed.    Family History  Problem Relation Age of Onset  . Heart disease Father        passed after by pass surgery  . Hypertension Father   . Hyperlipidemia Father   . Other Sister        auto immune  issues  . Gout Sister   . Autoimmune disease Sister        fatigue, rashes  . Osteopenia Sister   . Breast cancer Sister   . Osteoporosis Mother     Social History   Socioeconomic History  . Marital status: Married    Spouse name: Not on file  . Number of children: Not on file  . Years of education: Not on file  . Highest education level: Not on file  Occupational History  . Not on file  Tobacco Use  . Smoking status: Never Smoker  . Smokeless tobacco: Never Used  Vaping Use  . Vaping Use: Never used  Substance and Sexual Activity  . Alcohol use: No    Comment: 1-2 beers a week or 1-2 glasses of wine a week  . Drug use: No  . Sexual activity: Yes    Partners: Male  Other Topics Concern  . Not on file  Social History Narrative   Married, 2 grown children.   Lives in [place], [place] from [place].   Occupation: Optician, dispensing   No tob, 1-2 beer/wine per week.  No  hx of drug abuse or alcohol probs.   Enjoys outdoors, snow skiing, swimming, running, hiking.   Social Determinants of Health   Financial Resource Strain:   . Difficulty of Paying Living Expenses:   Food Insecurity:   . Worried About Charity fundraiser in the Last Year:   . Arboriculturist in the Last Year:   Transportation Needs:   . Film/video editor (Medical):   Marland Kitchen Lack of Transportation (Non-Medical):   Physical Activity:   . Days of Exercise per Week:   . Minutes of Exercise per Session:   Stress:   . Feeling of Stress :   Social Connections:   . Frequency of Communication with Friends and Family:   . Frequency of Social Gatherings with Friends and Family:   . Attends Religious Services:   . Active Member of Clubs or Organizations:   . Attends Archivist Meetings:   Marland Kitchen Marital Status:   Intimate Partner Violence:   . Fear of Current or Ex-Partner:   . Emotionally Abused:   Marland Kitchen Physically Abused:   . Sexually Abused:     Outpatient Medications Prior to Visit  Medication Sig  Dispense Refill  . acyclovir (ZOVIRAX) 400 MG tablet Take 400 mg by mouth 2 (two) times daily.    . Calcium Carbonate-Vitamin D (CALCIUM-VITAMIN D) 500-200 MG-UNIT per tablet Take 1 tablet by mouth 2 (two) times daily.     Marland Kitchen estradiol (ESTRACE) 0.1 MG/GM vaginal cream Place 1 Applicatorful vaginally once a week. Pt uses every other week.    . multivitamin (THERAGRAN) tablet Take 1 tablet by mouth daily. One a day    . hydrocortisone 2.5 % cream APPLY SPARINGLY TO AFFECTED AREA 2 TO 4 TIMES A DAY (Patient not taking: Reported on 09/16/2019)  2  . Tea Tree Oil OIL by Does not apply route 2 (two) times daily. (Patient not taking: Reported on 09/16/2019)     No facility-administered medications prior to visit.    Allergies  Allergen Reactions  . Cefaclor Rash    ROS Review of Systems  Constitutional: Negative for appetite change, chills, fatigue and fever.  HENT: Negative for congestion, dental problem, ear pain and sore throat.   Eyes: Negative for discharge, redness and visual disturbance.  Respiratory: Negative for cough, chest tightness, shortness of breath and wheezing.   Cardiovascular: Negative for chest pain, palpitations and leg swelling.  Gastrointestinal: Negative for abdominal pain, blood in stool, diarrhea, nausea and vomiting.  Genitourinary: Negative for difficulty urinating, dysuria, flank pain, frequency, hematuria and urgency.  Musculoskeletal: Negative for arthralgias, back pain, joint swelling, myalgias and neck stiffness.  Skin: Negative for pallor and rash.  Neurological: Negative for dizziness, speech difficulty, weakness and headaches.  Hematological: Negative for adenopathy. Does not bruise/bleed easily.  Psychiatric/Behavioral: Negative for confusion and sleep disturbance. The patient is not nervous/anxious.     PE; Vitals with BMI 09/16/2019 05/04/2019 09/24/2018  Height 5\' 4"  5' 4.25" 5' 4.25"  Weight 127 lbs 132 lbs 3 oz 123 lbs 6 oz  BMI 21.79 34.74 25.95   Systolic 638 756 433  Diastolic 82 81 69  Pulse 74 74 57  O2 sat on RA today is 99% Exam chaperoned by Deveron Furlong, CMA.  Gen: Alert, well appearing.  Patient is oriented to person, place, time, and situation. AFFECT: pleasant, lucid thought and speech. ENT: Ears: EACs clear, normal epithelium.  TMs with good light reflex and landmarks bilaterally.  Eyes: no injection, icteris,  swelling, or exudate.  EOMI, PERRLA. Nose: no drainage or turbinate edema/swelling.  No injection or focal lesion.  Mouth: lips without lesion/swelling.  Oral mucosa pink and moist.  Dentition intact and without obvious caries or gingival swelling.  Oropharynx without erythema, exudate, or swelling.  Neck: supple/nontender.  No LAD, mass, or TM.  Carotid pulses 2+ bilaterally, without bruits. CV: RRR, no m/r/g.   LUNGS: CTA bilat, nonlabored resps, good aeration in all lung fields. ABD: soft, NT, ND, BS normal.  No hepatospenomegaly or mass.  No bruits. EXT: no clubbing, cyanosis, or edema.  Musculoskeletal: no joint swelling, erythema, warmth, or tenderness.  ROM of all joints intact. Skin - no sores or suspicious lesions or rashes or color changes   Pertinent labs:  Lab Results  Component Value Date   TSH 1.95 09/06/2019   Lab Results  Component Value Date   WBC 4.8 09/06/2019   HGB 12.8 09/06/2019   HCT 38.2 09/06/2019   MCV 85.1 09/06/2019   PLT 189.0 09/06/2019   Lab Results  Component Value Date   CREATININE 0.79 09/06/2019   BUN 17 09/06/2019   NA 135 09/06/2019   K 4.2 09/06/2019   CL 100 09/06/2019   CO2 29 09/06/2019  Glucose 90 on 09/06/19  Lab Results  Component Value Date   ALT 14 09/06/2019   AST 18 09/06/2019   ALKPHOS 70 09/06/2019   BILITOT 0.5 09/06/2019   Lab Results  Component Value Date   CHOL 184 09/06/2019   Lab Results  Component Value Date   HDL 67.50 09/06/2019   Lab Results  Component Value Date   LDLCALC 102 (H) 09/06/2019   Lab Results  Component  Value Date   TRIG 72.0 09/06/2019   Lab Results  Component Value Date   CHOLHDL 3 09/06/2019   Lab Results  Component Value Date   HGBA1C 6.0 09/06/2019   ASSESSMENT AND PLAN:   Health maintenance exam: Reviewed age and gender appropriate health maintenance issues (prudent diet, regular exercise, health risks of tobacco and excessive alcohol, use of seatbelts, fire alarms in home, use of sunscreen).  Also reviewed age and gender appropriate health screening as well as vaccine recommendations. Vaccines: Pneumovax 23->given today.  Covid 19->UTD. Otherwise all vaccines UTD. Labs: reviewed recent fasting HP labs in detail today as well as a1c (stable prediabetes). Cervical ca screening: per GYN, planned around 09/2019. Breast ca screening: per GYN, planned around 09/2019. Colon ca screening:  Next colonoscopy 2029. Osteoporosis screening: DEXA due for her q78yr repeat->ordered today.  An After Visit Summary was printed and given to the patient.  FOLLOW UP:  Return in about 1 year (around 09/15/2020) for annual CPE (fasting).  Signed:  Crissie Sickles, MD           09/16/2019

## 2019-10-06 ENCOUNTER — Other Ambulatory Visit: Payer: Self-pay

## 2019-10-06 ENCOUNTER — Ambulatory Visit: Payer: Managed Care, Other (non HMO) | Admitting: Podiatry

## 2019-10-06 ENCOUNTER — Encounter: Payer: Self-pay | Admitting: Podiatry

## 2019-10-06 VITALS — Temp 97.2°F

## 2019-10-06 DIAGNOSIS — M7661 Achilles tendinitis, right leg: Secondary | ICD-10-CM | POA: Diagnosis not present

## 2019-10-06 DIAGNOSIS — M722 Plantar fascial fibromatosis: Secondary | ICD-10-CM

## 2019-10-06 NOTE — Progress Notes (Signed)
Subjective:   Patient ID: Bonnie [name], female   DOB: 69 y.o.   MRN: [id number]   HPI States they seem to be better but I am still having some discomfort in the back and bottom of my right heel and I did stop running for about the last week.  I am not sure the degree I am better because have not really test   ROS      Objective:  Physical Exam  Neurovascular status intact with patient's right plantar and posterior heel both showing mild discomfort with discomfort at the insertion of the 2.  There is no equinus condition noted and patient has reduced edema or calor noted around the area     Assessment:  Achilles tendinitis fasciitis-like symptoms right     Plan:  H&P conditions reviewed and I recommended anti-inflammatories physical therapy and topical medication.  Patient will be seen back for [place] to recheck again in the next 4 weeks and is encouraged to call with questions and may need new orthotics depending on response to continued conservative care with evaluation of old orthotics done today

## 2019-10-13 ENCOUNTER — Ambulatory Visit (HOSPITAL_BASED_OUTPATIENT_CLINIC_OR_DEPARTMENT_OTHER)
Admission: RE | Admit: 2019-10-13 | Discharge: 2019-10-13 | Disposition: A | Discharge status: Home / Self Care | Payer: Managed Care, Other (non HMO) | Source: Ambulatory Visit | Attending: Family Medicine | Admitting: Family Medicine

## 2019-10-13 ENCOUNTER — Other Ambulatory Visit: Payer: Self-pay

## 2019-10-13 DIAGNOSIS — M8580 Other specified disorders of bone density and structure, unspecified site: Secondary | ICD-10-CM | POA: Diagnosis present

## 2019-10-13 DIAGNOSIS — E2839 Other primary ovarian failure: Secondary | ICD-10-CM | POA: Diagnosis present

## 2019-10-13 DIAGNOSIS — Z13820 Encounter for screening for osteoporosis: Secondary | ICD-10-CM | POA: Insufficient documentation

## 2019-10-16 ENCOUNTER — Encounter: Payer: Self-pay | Admitting: Family Medicine

## 2020-02-21 DIAGNOSIS — H16223 Keratoconjunctivitis sicca, not specified as Sjogren's, bilateral: Secondary | ICD-10-CM | POA: Diagnosis not present

## 2020-02-28 ENCOUNTER — Telehealth: Payer: Self-pay

## 2020-02-28 DIAGNOSIS — R42 Dizziness and giddiness: Secondary | ICD-10-CM | POA: Diagnosis not present

## 2020-02-28 DIAGNOSIS — H6502 Acute serous otitis media, left ear: Secondary | ICD-10-CM | POA: Diagnosis not present

## 2020-02-28 NOTE — Telephone Encounter (Signed)
PCP was made aware of situation, asked if he felt appropriate for a virtual and a possible provider follow up appointment or lab visit. Approval given for virtual today if patient wanted. Contacted patient and she was [facility], believes it may be vertigo which last occurred about 20 years ago. She called yesterday for an appointment but we did not have anything available at the time. I apologized but let her know I was calling to see how she was doing or if she had been evaluated yet. She will call back if interested in 1030 or 11am slot with PCP for today.

## 2020-02-28 NOTE — Telephone Encounter (Signed)
[facility] - Client TELEPHONE ADVICE RECORD [facility] Patient Name: Bonnie [name] Gender: Female DOB: 1950/09/21 Age: 70 Y 39 M 27 D Return Phone Number: [phone] (Primary), [phone] (Secondary) Address: City/State/Zip: [place] [address] Client [facility] - Client Client Site [facility] - Day Physician Crissie Sickles - MD Contact Type Call Who Is Calling Patient / Member / Family / Caregiver Call Type Triage / Clinical Relationship To Patient Self Return Phone Number [phone] (Primary) Chief Complaint Dizziness Reason for Call Symptomatic / Request for Health Information Initial Comment Caller states, pt gets lightheaded and concern for high bp. Translation No Nurse Assessment Nurse: Zenia Resides, RN, Diane Date/Time Eilene Ghazi Time): 02/27/2020 10:51:33 AM Confirm and document reason for call. If symptomatic, describe symptoms. ---Caller states she gets lightheaded when she puts her head back like putting drops in her eyes. Has happened several times this morning. Pt also has a tight muscle in her butt this morning. Does the patient have any new or worsening symptoms? ---Yes Will a triage be completed? ---Yes Related visit to physician within the last 2 weeks? ---No Does the PT have any chronic conditions? (i.e. diabetes, asthma, this includes High risk factors for pregnancy, etc.) ---No Is this a behavioral health or substance abuse call? ---No Guidelines Guideline Title Affirmed Question Affirmed Notes Nurse Date/Time (Eastern Time) Dizziness - Lightheadedness [1] MODERATE dizziness (e.g., interferes with normal activities) AND [2] has NOT been evaluated by physician for this (Exception: dizziness caused by heat exposure, sudden standing, or poor fluid intake) Zenia Resides, RN, Diane 02/27/2020 10:53:45 AM Disp. Time Eilene Ghazi Time) Disposition Final User 02/27/2020 10:57:53 AM See PCP within 24 Hours Yes  Zenia Resides, RN, Diane PLEASE NOTE: All timestamps contained within this report are represented as [place] Standard Time. CONFIDENTIALTY NOTICE: This fax transmission is intended only for the addressee. It contains information that is legally privileged, confidential or otherwise protected from use or disclosure. If you are not the intended recipient, you are strictly prohibited from reviewing, disclosing, copying using or disseminating any of this information or taking any action in reliance on or regarding this information. If you have received this fax in error, please notify us immediately by telephone so that we can arrange for its return to [place]. Phone: [phone], Toll-Free: [phone], Fax: [phone] Page: 2 of 2 Call Id: [id number] [place] Disagree/Comply Comply Caller Understands Yes PreDisposition Did not know what to do Care Advice Given Per Guideline SEE PCP WITHIN 24 HOURS: * IF OFFICE WILL BE OPEN: You need to be examined within the next 24 hours. Call your doctor (or NP/PA) when the office opens and make an appointment. DRINK FLUIDS: * Drink several glasses of fruit juice, other clear fluids or water. CALL BACK IF: * Passes out (faints) CARE ADVICE given per Dizziness (Adult) guideline. Comments User: Hildred Priest, RN Date/Time Eilene Ghazi Time): 02/27/2020 10:55:09 AM Pt. has some post nasal drip. No ear pain. No sinus pain Referrals REFERRED TO PCP OFFICE

## 2020-06-15 ENCOUNTER — Encounter: Payer: Self-pay | Admitting: Family Medicine

## 2020-06-15 ENCOUNTER — Ambulatory Visit (INDEPENDENT_AMBULATORY_CARE_PROVIDER_SITE_OTHER): Payer: Medicare Other | Admitting: Family Medicine

## 2020-06-15 ENCOUNTER — Other Ambulatory Visit: Payer: Self-pay

## 2020-06-15 VITALS — BP 125/76 | HR 58 | Temp 97.8°F | Resp 18 | Wt 130.6 lb

## 2020-06-15 DIAGNOSIS — M79671 Pain in right foot: Secondary | ICD-10-CM | POA: Diagnosis not present

## 2020-06-15 DIAGNOSIS — R053 Chronic cough: Secondary | ICD-10-CM

## 2020-06-15 DIAGNOSIS — M9261 Juvenile osteochondrosis of tarsus, right ankle: Secondary | ICD-10-CM

## 2020-06-15 DIAGNOSIS — G8929 Other chronic pain: Secondary | ICD-10-CM | POA: Diagnosis not present

## 2020-06-15 MED ORDER — FLUTICASONE PROPIONATE 50 MCG/ACT NA SUSP: 2.0000 | Freq: Every day | NASAL | 6 refills | Status: DC

## 2020-06-15 NOTE — Progress Notes (Signed)
OFFICE VISIT  06/18/2020  CC:  Chief Complaint  Patient presents with  . Foot Pain    Right foot: heels/ back of foot. Pt was seeing Podiatry in 2018, she would like to see PT.  Marland Kitchen Cough    Lasting about 8-9 years, more at night, early morning. Pt does not take anything regularly for this.     HPI:    Patient is a 70 y.o. Caucasian female who presents for right foot pain as well as "chronic cough". Says coughing daily for the last 8 yrs, worse at night and she props up her head and it improves some.   Chronic mild nasal cong and PND that waxes and wanes.  Feels a little tickle.  Not using cough drops very often.  Denies GER sx's.   Allegra trial x 1 mo in the past no help.  Has never taken nasal steroid. No chest pain or tightness or wheezing.  No hemoptysis or wt loss. ENT MD in remote past: "cyst in back of throat", removed it and nothing changed.  Long hx of R foot posterior aspect of heel pain, hx of post calc spur, podiatrist made her orthotic in the past. Then saw Dr. Paulla Dolly and was dx'd with achilles tendonitis, ? Steroid inj given and it helped. Ongoing pain in the area for years.  She runs for exercise. She asks about potential next step.  ROS as above, plus--> no fevers, no CP, no SOB, no wheezing, no dizziness, no HAs, no rashes, no melena/hematochezia.  No polyuria or polydipsia.  No myalgias or arthralgias.  No focal weakness, paresthesias, or tremors.  No acute vision or hearing abnormalities.  No dysuria or unusual/new urinary urgency or frequency.  No recent changes in lower legs. No n/v/d or abd pain.  No palpitations.     Past Medical History:  Diagnosis Date  . Genital herpes in women 02-17-09   she "plays with" the dose: 1/2-1 tab per day  . Haglund's deformity 10/2015   Right; with posterior calcaneal heel spur (Podiatrist)  . History of fracture of phalanx of thumb 09/2016   [facility]: left thumb, possible impacted fracture of distal portion of proximal  phalanx of thumb.  Splint rx'd and ortho f/u advised.  . Lactose intolerance    ?  . Multiple allergies 08/02/2010  . Osteopenia 2014   2014 DEXA: Lowest tscore - 1.7 at left femoral neck.  07/2017 T-score -2.0 femur neck  . Prediabetes 2014   Saw nutritionist (HbA1c 6.1% 02/2014).  A1c 5.7% 08/2015.  A1c 6.0% 08/2017. A1c 6.1% 09/2018.  Marland Kitchen Tendonitis, Achilles, right 2018   Podiatrist  . Vallecular cyst 04/09/15   ? cause of chronic cough?--Dr. Simeon Craft (ENT) removed this and her cough resolved.    Past Surgical History:  Procedure Laterality Date  . broken leg  6 months old  . cataract surgery  4-12 and 5-12  . COLONOSCOPY  2009; 01/04/18   2009 normal.  Rpt 01/04/18 normal (Dr. Collene Mares).  Recall 10 yrs.  Marland Kitchen COSMETIC SURGERY  70 yrs old   nose  . DEXA  2014; 2019   2014 T-score -1.7.            07/2017 T-score -2.0 femur neck. 09/2019 -2.1.  . INCISION AND DRAINAGE  08/11/2016   Epidermal inclusion cyst on back (done by MD at [facility] in [place]).  Marland Kitchen teeth pulled  70 yrs old  . Vallecular cyst excision  2017   cough resolved  after this was removed.    Outpatient Medications Prior to Visit  Medication Sig Dispense Refill  . acyclovir (ZOVIRAX) 400 MG tablet Take 400 mg by mouth 2 (two) times daily.    . Calcium Carbonate-Vitamin D (CALCIUM-VITAMIN D) 500-200 MG-UNIT per tablet Take 1 tablet by mouth 2 (two) times daily.    Marland Kitchen estradiol (ESTRACE) 0.1 MG/GM vaginal cream Place 1 Applicatorful vaginally once a week. Pt uses every other week.    . hydrocortisone 2.5 % cream APPLY SPARINGLY TO AFFECTED AREA 2 TO 4 TIMES A DAY  2  . multivitamin (THERAGRAN) tablet Take 1 tablet by mouth daily. One a day    . Tea Tree Oil OIL by Does not apply route 2 (two) times daily.      No facility-administered medications prior to visit.    No Active Allergies  ROS As per HPI  PE: Vitals with BMI 06/15/2020 09/16/2019 05/04/2019  Height - 5\' 4"  5' 4.25"  Weight 130 lbs 10 oz 127 lbs 132  lbs 3 oz  BMI - 47.82 95.62  Systolic 130 865 784  Diastolic 76 82 81  Pulse 58 74 74     Gen: Alert, well appearing.  Patient is oriented to person, place, time, and situation. AFFECT: pleasant, lucid thought and speech. ONG:EXBM: no injection, icteris, swelling, or exudate.  EOMI, PERRLA. Nose with mild congestion. Mouth: lips without lesion/swelling.  Oral mucosa pink and moist. Oropharynx without erythema, exudate, or swelling.  CV: RRR, no m/r/g.   LUNGS: CTA bilat, nonlabored resps, good aeration in all lung fields. EXT: no clubbing or cyanosis.  no edema.  R LL normal, ankle ROM fully intact.  Mild TTP over distal aspect of achilles tendon, most at insertion point on calcaneus.  No erythema or warmth.  No swelling.  No ankle tenderness otherwise.  LABS:    Chemistry      Component Value Date/Time   NA 135 09/06/2019 0835   K 4.2 09/06/2019 0835   CL 100 09/06/2019 0835   CO2 29 09/06/2019 0835   BUN 17 09/06/2019 0835   CREATININE 0.79 09/06/2019 0835      Component Value Date/Time   CALCIUM 9.5 09/06/2019 0835   ALKPHOS 70 09/06/2019 0835   AST 18 09/06/2019 0835   ALT 14 09/06/2019 0835   BILITOT 0.5 09/06/2019 0835     Lab Results  Component Value Date   WBC 4.8 09/06/2019   HGB 12.8 09/06/2019   HCT 38.2 09/06/2019   MCV 85.1 09/06/2019   PLT 189.0 09/06/2019   IMPRESSION AND PLAN:  1) R posterior heal pain, chronic: achilles pain/strain associated with posterior calc heal spur. Recommended she see sports med MD as the next step.  2) Chronic cough: I still suspect this is PND/allergic related. Recommended trial of flonase daily for 1 mo to see if response. Pt wanted to take this step rather than pulm referral or CXR at this time.  Spent 32 min with pt today reviewing HPI, reviewing relevant past history, doing exam, reviewing and discussing lab and imaging data, and formulating plans.  An After Visit Summary was printed and given to the  patient.  FOLLOW UP: Return for as needed. Cpe in 3-4 mo  Signed:  Crissie Sickles, MD           06/18/2020

## 2020-06-20 ENCOUNTER — Ambulatory Visit: Payer: Medicare Other | Admitting: Family Medicine

## 2020-06-20 ENCOUNTER — Encounter: Payer: Self-pay | Admitting: Family Medicine

## 2020-06-20 ENCOUNTER — Ambulatory Visit: Payer: Self-pay

## 2020-06-20 ENCOUNTER — Other Ambulatory Visit: Payer: Self-pay

## 2020-06-20 VITALS — BP 110/78 | HR 74 | Ht 64.0 in | Wt 130.0 lb

## 2020-06-20 DIAGNOSIS — G8929 Other chronic pain: Secondary | ICD-10-CM

## 2020-06-20 DIAGNOSIS — M79671 Pain in right foot: Secondary | ICD-10-CM | POA: Diagnosis not present

## 2020-06-20 MED ORDER — NITROGLYCERIN 0.2 MG/HR TD PT24: MEDICATED_PATCH | TRANSDERMAL | 1 refills | Status: DC

## 2020-06-20 NOTE — Progress Notes (Signed)
   I, [name], LAT, ATC acting as a scribe for [name], MD.  Subjective:    I'm seeing this patient as a consultation for: Dr. Shawnie Dapper. Note will be routed back to referring provider/PCP.  CC: Right foot/heel pain  HPI: Pt is a 70 y/o female c/o chronic R heel pain x years. Pt was seen by podiatry on 10/05/20 and dx w/ Achille's tendonitis and plantar fascitis. Podiatry advised NSAIDs, PT, and topical creams. Pt also has a hx of post calcaneous spurs. Pt is very active and runs for exercise. Pt locates pain to the distal aspect of the R Achille's. Pt doesn't like the prior recommendation of rest and would like to address the underlying issue.  Swelling: no Aggravates: running, wearing less supportive shoes Treatments tried: orthotics, arch straps  Dx imaging: 07/05/20 R foot XR  Past medical history, Surgical history, Family history, Social history, Allergies, and medications have been entered into the medical record, reviewed.   Review of Systems: No new headache, visual changes, nausea, vomiting, diarrhea, constipation, dizziness, abdominal pain, skin rash, fevers, chills, night sweats, weight loss, swollen lymph nodes, body aches, joint swelling, muscle aches, chest pain, shortness of breath, mood changes, visual or auditory hallucinations.   Objective:    Vitals:   06/20/20 1333  BP: 110/78  Pulse: 74  SpO2: 97%   General: Well Developed, well nourished, and in no acute distress.  Neuro/Psych: Alert and oriented x3, extra-ocular muscles intact, able to move all 4 extremities, sensation grossly intact. Skin: Warm and dry, no rashes noted.  Respiratory: Not using accessory muscles, speaking in full sentences, trachea midline.  Cardiovascular: Pulses palpable, no extremity edema. Abdomen: Does not appear distended. MSK: Right heel nodule posterior calcaneus otherwise normal-appearing EXTR normal motion. Minimally tender posterior calcaneus. Intact  strength. Stable ligamentous exam. Pulses capillary fill and sensation are intact distally.  Lab and Radiology Results  X-ray images right foot obtained Jul 06, 2019 personally independently interpreted today. Osteophyte present posterior calcaneus.   Diagnostic Limited MSK Ultrasound of: Right Achilles tendon Achilles tendon normal appearing until insertion onto calcaneus were calcific change present consistent with Haglund deformity. No retrocalcaneal bursitis present. Impression: Chronic calcific tendinopathy Achilles tendon   Impression and Recommendations:    Assessment and Plan: 70 y.o. female with right posterior heel pain consistent with Haglund deformity/chronic calcific tendinopathy.  Plan for eccentric exercises and nitroglycerin patch protocol.  Continue heel left and advance activity as tolerated.  Recheck in 6 weeks.Marland Kitchen  PDMP not reviewed this encounter. Orders Placed This Encounter  Procedures  . [place] LIMITED JOINT SPACE STRUCTURES LOW RIGHT(NO LINKED CHARGES)    Order Specific Question:   Reason for Exam (SYMPTOM  OR DIAGNOSIS REQUIRED)    Answer:   rt heel pain    Order Specific Question:   Preferred imaging location?    Answer:   [place]   Meds ordered this encounter  Medications  . nitroGLYCERIN (NITRODUR - DOSED IN MG/24 HR) 0.2 mg/hr patch    Sig: Apply 1/4 patch daily to tendon for tendonitis.    Dispense:  30 patch    Refill:  1    Discussed warning signs or symptoms. Please see discharge instructions. Patient expresses understanding.   The above documentation has been reviewed and is accurate and complete [name], M.D.

## 2020-06-20 NOTE — Patient Instructions (Signed)
Thank you for coming in today.  Nitroglycerin Protocol   Apply 1/4 nitroglycerin patch to affected area daily.  Change position of patch within the affected area every 24 hours.  You may experience a headache during the first 1-2 weeks of using the patch, these should subside.  If you experience headaches after beginning nitroglycerin patch treatment, you may take your preferred over the counter pain reliever.  Another side effect of the nitroglycerin patch is skin irritation or rash related to patch adhesive.  Please notify our office if you develop more severe headaches or rash, and stop the patch.  Tendon healing with nitroglycerin patch may require 12 to 24 weeks depending on the extent of injury.  Men should not use if taking Viagra, Cialis, or Levitra.   Do not use if you have migraines or rosacea.   Do the heel exercises.  Go from up to down slowly.  15-30 reps 2-3x daily.   Ok to advance activity as tolerated.   Limping is a good indicator that you are over doing it.

## 2020-06-29 ENCOUNTER — Telehealth: Payer: Self-pay

## 2020-06-29 DIAGNOSIS — Z0000 Encounter for general adult medical examination without abnormal findings: Secondary | ICD-10-CM

## 2020-06-29 DIAGNOSIS — R7303 Prediabetes: Secondary | ICD-10-CM

## 2020-06-29 NOTE — Telephone Encounter (Signed)
Pt scheduled appt for 08/16/20 and wanted to know if she could do her labs before her CPE visit. Lab appt is scheduled for 08/10/20 and pt is aware that approval is needed to keep this appt. Will contact pt when approval or denial is given.

## 2020-06-29 NOTE — Telephone Encounter (Signed)
Yes that's fine. Labs ordered.

## 2020-07-02 NOTE — Telephone Encounter (Signed)
Spoke with pt regarding appt.  ?

## 2020-07-11 ENCOUNTER — Encounter: Payer: Self-pay | Admitting: Family Medicine

## 2020-07-11 ENCOUNTER — Telehealth: Payer: Self-pay

## 2020-07-11 ENCOUNTER — Telehealth (INDEPENDENT_AMBULATORY_CARE_PROVIDER_SITE_OTHER): Payer: Medicare Other | Admitting: Family Medicine

## 2020-07-11 VITALS — HR 93 | Temp 98.8°F

## 2020-07-11 DIAGNOSIS — J069 Acute upper respiratory infection, unspecified: Secondary | ICD-10-CM

## 2020-07-11 DIAGNOSIS — K625 Hemorrhage of anus and rectum: Secondary | ICD-10-CM | POA: Insufficient documentation

## 2020-07-11 DIAGNOSIS — U071 COVID-19: Secondary | ICD-10-CM | POA: Diagnosis not present

## 2020-07-11 DIAGNOSIS — K641 Second degree hemorrhoids: Secondary | ICD-10-CM | POA: Insufficient documentation

## 2020-07-11 DIAGNOSIS — K5900 Constipation, unspecified: Secondary | ICD-10-CM | POA: Insufficient documentation

## 2020-07-11 MED ORDER — NIRMATRELVIR/RITONAVIR (PAXLOVID)TABLET: 3.0000 | ORAL_TABLET | Freq: Two times a day (BID) | ORAL | 0 refills | Status: AC

## 2020-07-11 NOTE — Telephone Encounter (Signed)
Pt called after hours x 2 for fever/COVID. On Call provider ([place]) advised OTC meds and f/u with PCP today. Pt has appt with PCP today.    [place] [facility] - Client TELEPHONE ADVICE RECORD [facility] Patient Name: Bonnie [name] Gender: Female DOB: [birth year] Age: 70 Y 10 M 10D Return Phone Number:[phone] (Primary), [phone] (Secondary) [place] - Client Client Site [facility] Physician Crissie Sickles - MD Contact Type Call Who Is Calling Patient / Member / Family / Caregiver Call Type Triage / Clinical Caller Name Lane Kjos Relationship To Patient Spouse Return Phone Number [phone] (Primary) Chief Complaint FEVER - >= 104 or <97 Reason for Call Symptomatic / Request for [place] states wife has COVID. States she has a cough and a fever of 104. Wants to know what they should be doing. Translation No  Nurse Assessment Nurse: Louretta Shorten, RN, [place] Date/Time Eilene Ghazi Time): 07/10/2020 6:28:29 PM Confirm and document reason for call. If symptomatic, describe symptoms. ---Caller states wife has COVID. States she has a cough, fever of 104, and vomiting x 1 episode. Caller states sxs started Saturday. Caller denies difficulty breathing. Does the patient have any new or worsening symptoms? ---Yes Will a triage be completed? ---Yes Related visit to physician within the last 2 weeks? ---No Does the PT have any chronic conditions? (i.e. diabetes, asthma, this includes High risk factors for pregnancy, etc.) ---Yes List chronic conditions. ---Genital Herpes, estrodial, uses nitroglycerin on achilles tendon Is this a behavioral health or substance abuse call? ---No Guidelines Guideline Title Affirmed Question Affirmed Notes Nurse Date/Time ([place] Time) COVID-19 - Diagnosed or Suspected Fever > 103 F (39.4C) Louretta Shorten, [place], [place] 07/10/2020  6:31:55PM Disp. Time Eilene Ghazi Time) Disposition Final User 07/10/2020 6:27:04 PM Send to Urgent Queue Delphina Cahill, Richard PLEASE NOTE: All timestamps contained within this report are represented as [place] Standard Time. CONFIDENTIALTY NOTICE: This fax transmission is intended only for the addressee. It contains information that is legally privileged, confidential or otherwise protected from use or disclosure. If you are not the intended recipient, you are strictly prohibited from reviewing, disclosing, copying using or disseminating any of this information or taking any action in reliance on or regarding this information. If you have received this fax in error, please notify us immediately by telephone so that we can arrange for its return to [place]. Phone: [phone], Toll-Free: [phone], Fax: [phone] Page: 2 of 2 Call Id: [id number] 07/10/2020 6:35:57 PM See HCP within 4 Hours (or PCP triage) Yes Louretta Shorten, RN, [place] Caller Disagree/Comply Comply Caller Understands Yes PreDisposition Call Doctor Care Advice Given Per Guideline SEE HCP (OR PCP TRIAGE) WITHIN 4 HOURS: * IF OFFICE WILL BE OPEN: You need to be seen within the next 3 or 4 hours. Call your doctor (or NP/PA) now or as soon as the office opens. CALL BACK IF: * You become worse * Fever: For fever over 101 F (38.3 C), take acetaminophen every 4 to 6 hours (Adults 650 mg) OR ibuprofen every 6 to 8 hours (Adults 400 mg). Before taking any medicine, read all the instructions on the package. Do not take aspirin unless your doctor has prescribed it for you. GENERAL CARE ADVICE FOR COVID-19 SYMPTOMS: * For fevers above 101 F (38.3 C) take either acetaminophen or ibuprofen. FEVER MEDICINES: Comments User: [place], [place], RN Date/Time Eilene Ghazi Time): 07/10/2020 6:30:06 PM Current 02 sat 98%, HR 86 User: [place], Garrison, RN Date/Time Eilene Ghazi Time):  07/10/2020 6:31:05 PM Caller states she has been taking Water engineer: [place], Garrison, RN  Date/Time Eilene Ghazi Time): 07/10/2020 6:32:44 PM BMI 22.3 User: [place], Garrison, RN Date/Time Eilene Ghazi Time): 07/10/2020 6:34:31 PM Caller states she is vaccinated and fully boosted for covid. Referrals GO TO FACILITY UNDECIDED  __________________SECOND Christena Flake TO TRIAGE LINE BELOW_____________________________________  [facility] [facility] - Client TELEPHONE ADVICE RECORD [facility] Patient Name: Bonnie [name] Gender: Female DOB: 1950-04-03 Age: 79 Y 10 M 11D Return Phone Number: [phone] (Secondary) Client [place] [facility] - Client Client Site [place] - Day Physician Crissie Sickles - MD Contact Type Call Who Is Calling Patient / Member / Family / Caregiver Call Type Triage / Clinical Caller Name Nakaiya Beddow Relationship To Patient Spouse Return Phone Number [phone] (Secondary) Chief Complaint Fever (non urgent symptom) (> THREE MONTHS) Reason for Call Symptomatic / Request for [place] says that he just spoke with nurse [place] at the after hours about his wife's temp being 104, but it is actually 100.4 and wants to speak with her again. Translation No Nurse Assessment Nurse: Louretta Shorten, RN, [place] Date/Time Eilene Ghazi Time): 07/10/2020 10:40:30 PM Confirm and document reason for call. If symptomatic, describe symptoms. ---Caller states her temp was 100.4 instead of 104. Current temp is 99.5. Does the patient have any new or worsening symptoms? ---Yes Will a triage be completed? ---Yes Related visit to physician within the last 2 weeks? ---No Does the PT have any chronic conditions? (i.e. diabetes, asthma, this includes High risk factors for pregnancy, etc.) ---Yes List chronic conditions. ---genital herpes Is this a behavioral health or substance abuse call? ---No Guidelines Guideline Title Affirmed Question Affirmed Notes Nurse Date/Time  ([place] Time) COVID-19 - Diagnosed or Suspected [1] HIGH RISK for severe COVID complications (e.g., weak immune system, age > 41 years, obesity with BMI > 57, pregnant, chronic Louretta Shorten, [place], [place] 07/10/2020 10:41:31 PM PLEASE NOTE: All timestamps contained within this report are represented as [place] Standard Time. CONFIDENTIALTY NOTICE: This fax transmission is intended only for the addressee. It contains information that is legally privileged, confidential or otherwise protected from use or disclosure. If you are not the intended recipient, you are strictly prohibited from reviewing, disclosing, copying using or disseminating any of this information or taking any action in reliance on or regarding this information. If you have received this fax in error, please notify us immediately by telephone so that we can arrange for its return to [place]. Phone: [phone], Toll-Free: [phone], Fax: [phone] Page: 2 of 3 Call Id: [id number] Guidelines Guideline Title Affirmed Question Affirmed Notes Nurse Date/Time Eilene Ghazi Time) lung disease or other chronic medical condition) AND [2] COVID symptoms (e.g., cough, fever) (Exceptions: Already seen by PCP and no new or worsening symptoms.) Disp. Time Eilene Ghazi Time) Disposition Final User 07/10/2020 9:38:35 PM Send To RN Personal Sherol Dade, RN, [place] 07/10/2020 10:21:04 PM Send To RN Personal Lovena Le, RN, [place] Glad 07/10/2020 10:51:17 PM Called On-Call Provider Louretta Shorten, [place], [place] 07/10/2020 10:54:32 PM Call PCP Now Yes Louretta Shorten, RN, [place] Caller Disagree/Comply Comply Caller Understands Yes PreDisposition Call Doctor Care Advice Given Per Guideline CALL PCP NOW: * I'll page the on-call provider now. If you haven't heard from the provider (or me) within 30 minutes, call again. FEVER MEDICINES: * For fevers above 101 F (38.3 C) take either acetaminophen or ibuprofen. * ACETAMINOPHEN REGULAR STRENGTH TYLENOL: Take 650 mg (two 325 mg pills) by mouth  every 4-6 hours as  needed. Each Regular Strength Tylenol pill has 325 mg of acetaminophen. The most you should take each day is 3,250 mg (10 pills a day). CALL BACK IF: * You become worse Comments User: [place], Garrison, RN Date/Time Eilene Ghazi Time): 07/10/2020 10:48:43 PM Caller has covid and spoke with me earlier- her outcome was to be evaluated wiithin 3 to 4 hours- caller is calling back to inform me that her temperature was actually 100.4 instead of 104. Caller was re-triaged based on current sxs. User: [place], Garrison, RN Date/Time Eilene Ghazi Time): 07/10/2020 10:49:53 PM Previous traige note from earlier tonight: "Caller states wife has COVID. States she has a cough, fever of 104, and vomiting x 1 episode. Caller states sxs started Saturday. Caller denies difficulty breathing" BMI 22.3 OB:SJGGEZM Herpes, estrodial, uses nitroglycerin on achilles tendon User: [place], Garrison, RN Date/Time Eilene Ghazi Time): 07/10/2020 10:52:15 PM [facility]: [phone] PLEASE NOTE: All timestamps contained within this report are represented as [place] Standard Time. CONFIDENTIALTY NOTICE: This fax transmission is intended only for the addressee. It contains information that is legally privileged, confidential or otherwise protected from use or disclosure. If you are not the intended recipient, you are strictly prohibited from reviewing, disclosing, copying using or disseminating any of this information or taking any action in reliance on or regarding this information. If you have received this fax in error, please notify us immediately by telephone so that we can arrange for its return to [place]. Phone: [phone], Toll-Free: [phone], Fax: [phone] Page: 3 of 3 Call Id: [id number] Comments User: [place], Garrison, RN Date/Time Eilene Ghazi Time): 07/10/2020 10:53:44 PM Caller informed of on-call MD instruction-caller verbalized understanding. Referrals REFERRED TO PCP OFFICE Paging DoctorName Phone DateTime  Result/ Outcome Message Type Notes Walker Kehr MD [phone] 07/10/2020 10:51:17 PM Called On Call Provider - Reached Doctor Paged Walker Kehr - MD 07/10/2020 10:52:02 PM Spoke with On Call - General Message Result On-call states for pt to take OTC meds for her sxs (i.e. Nyquil), and to follow-up with the office in the morning

## 2020-07-11 NOTE — Telephone Encounter (Signed)
Noted, thanks!

## 2020-07-11 NOTE — Progress Notes (Signed)
Virtual Visit via Video Note  I connected with Bonnie [name] on 07/11/20 at  4:00 PM EDT by a video enabled telemedicine application and verified that I am speaking with the correct person using two identifiers.  Location patient: home, [place] Location provider:work or home office Persons participating in the virtual visit: patient, provider  I discussed the limitations of evaluation and management by telemedicine and the availability of in person appointments. The patient expressed understanding and agreed to proceed.  Telemedicine visit is a necessity given the COVID-19 restrictions in place at the current time.  HPI: 70 y/o WF being seen for respiratory symptoms, covid POSITIVE.  HPI: Onset approx 3-4d ago of "sinus HA", lots of clear runny nose, some coughing spells (occ gagging with coughing and has post-tussive emesis occ), fatigue.  NO sob or wheezing.  Temp 100.9 last night. Covid test at home yesterday was positive.  Covid vaccine status: moderna x 2 + 2 boosters  ROS: See pertinent positives and negatives per HPI.  Past Medical History:  Diagnosis Date  . Genital herpes in women 02-17-09   she "plays with" the dose: 1/2-1 tab per day  . Haglund's deformity 10/2015   Right; with posterior calcaneal heel spur (Podiatrist)  . History of fracture of phalanx of thumb 09/2016   [facility]: left thumb, possible impacted fracture of distal portion of proximal phalanx of thumb.  Splint rx'd and ortho f/u advised.  . Lactose intolerance    ?  . Multiple allergies 08/02/2010  . Osteopenia 2014   2014 DEXA: Lowest tscore - 1.7 at left femoral neck.  07/2017 T-score -2.0 femur neck  . Prediabetes 2014   Saw nutritionist (HbA1c 6.1% 02/2014).  A1c 5.7% 08/2015.  A1c 6.0% 08/2017. A1c 6.1% 09/2018.  Marland Kitchen Tendonitis, Achilles, right 2018   Podiatrist  . Vallecular cyst 04/09/15   ? cause of chronic cough?--Dr. Simeon Craft (ENT) removed this and her cough resolved.    Past Surgical History:  Procedure  Laterality Date  . broken leg  6 months old  . cataract surgery  4-12 and 5-12  . COLONOSCOPY  2009; 01/04/18   2009 normal.  Rpt 01/04/18 normal (Dr. Collene Mares).  Recall 10 yrs.  Marland Kitchen COSMETIC SURGERY  70 yrs old   nose  . DEXA  2014; 2019   2014 T-score -1.7.            07/2017 T-score -2.0 femur neck. 09/2019 -2.1.  . INCISION AND DRAINAGE  08/11/2016   Epidermal inclusion cyst on back (done by MD at [facility] in [place]).  Marland Kitchen teeth pulled  70 yrs old  . Vallecular cyst excision  2017   cough resolved after this was removed.     Current Outpatient Medications:  .  acyclovir (ZOVIRAX) 400 MG tablet, Take 400 mg by mouth 2 (two) times daily., Disp: , Rfl:  .  Calcium Carbonate-Vitamin D (CALCIUM-VITAMIN D) 500-200 MG-UNIT per tablet, Take 1 tablet by mouth 2 (two) times daily., Disp: , Rfl:  .  estradiol (ESTRACE) 0.1 MG/GM vaginal cream, Place 1 Applicatorful vaginally once a week. Bonnie [name] uses every other week., Disp: , Rfl:  .  hydrocortisone 2.5 % cream, APPLY SPARINGLY TO AFFECTED AREA 2 TO 4 TIMES A DAY, Disp: , Rfl: 2 .  multivitamin (THERAGRAN) tablet, Take 1 tablet by mouth daily. One a day, Disp: , Rfl:  .  nitroGLYCERIN (NITRODUR - DOSED IN MG/24 HR) 0.2 mg/hr patch, Apply 1/4 patch daily to tendon for tendonitis., Disp: 30  patch, Rfl: 1 .  fluticasone (FLONASE) 50 MCG/ACT nasal spray, Place 2 sprays into both nostrils daily. (Patient not taking: Reported on 07/11/2020), Disp: 16 g, Rfl: 6  EXAM:  VITALS per patient if applicable:  Vitals with BMI 07/11/2020 06/20/2020 06/15/2020  Height - 5\' 4"  -  Weight - 130 lbs 130 lbs 10 oz  BMI - 49.7 -  Systolic - 026 378  Diastolic - 78 76  Pulse 93 74 58     GENERAL: alert, oriented, appears well and in no acute distress  HEENT: atraumatic, conjunttiva clear, no obvious abnormalities on inspection of external nose and ears  NECK: normal movements of the head and neck  LUNGS: on inspection no signs of respiratory distress,  breathing rate appears normal, no obvious gross SOB, gasping or wheezing  CV: no obvious cyanosis  MS: moves all visible extremities without noticeable abnormality  PSYCH/NEURO: pleasant and cooperative, no obvious depression or anxiety, speech and thought processing grossly intact  ASSESSMENT AND PLAN:  Discussed the following assessment and plan:  Acute URI with cough, covid positive. Completely vaccinated Bonnie [name], mild illness. She is within the window for oral antiviral treatment and wants to pursue this so I rx'd paxlovid today. Symptomatic care with otc allegra and she is already on flonase.  Ibuprofen 600 mg qid prn.   I discussed the assessment and treatment plan with the patient. The patient was provided an opportunity to ask questions and all were answered. The patient agreed with the plan and demonstrated an understanding of the instructions.   F/u: if not signif improved in 4-5d or if worsening prior  Signed:  Crissie Sickles, MD           07/11/2020

## 2020-08-10 ENCOUNTER — Ambulatory Visit (INDEPENDENT_AMBULATORY_CARE_PROVIDER_SITE_OTHER): Payer: Medicare Other

## 2020-08-10 ENCOUNTER — Other Ambulatory Visit: Payer: Self-pay

## 2020-08-10 DIAGNOSIS — Z0000 Encounter for general adult medical examination without abnormal findings: Secondary | ICD-10-CM | POA: Diagnosis not present

## 2020-08-10 DIAGNOSIS — R7303 Prediabetes: Secondary | ICD-10-CM

## 2020-08-10 LAB — COMPREHENSIVE METABOLIC PANEL
ALT: 13 U/L (ref 0–35)
AST: 17 U/L (ref 0–37)
Albumin: 4.2 g/dL (ref 3.5–5.2)
Alkaline Phosphatase: 73 U/L (ref 39–117)
BUN: 20 mg/dL (ref 6–23)
CO2: 30 mEq/L (ref 19–32)
Calcium: 9.7 mg/dL (ref 8.4–10.5)
Chloride: 102 mEq/L (ref 96–112)
Creatinine, Ser: 0.82 mg/dL (ref 0.40–1.20)
GFR: 73.74 mL/min (ref >60.00)
Glucose, Bld: 78 mg/dL (ref 70–99)
Potassium: 4.1 mEq/L (ref 3.5–5.1)
Sodium: 138 mEq/L (ref 135–145)
Total Bilirubin: 0.6 mg/dL (ref 0.2–1.2)
Total Protein: 6.5 g/dL (ref 6.0–8.3)

## 2020-08-10 LAB — CBC WITH DIFFERENTIAL/PLATELET
Basophils Absolute: 0 10*3/uL (ref 0.0–0.1)
Basophils Relative: 1.2 % (ref 0.0–3.0)
Eosinophils Absolute: 0.1 10*3/uL (ref 0.0–0.7)
Eosinophils Relative: 3.1 % (ref 0.0–5.0)
HCT: 36.7 % (ref 36.0–46.0)
Hemoglobin: 12.4 g/dL (ref 12.0–15.0)
Lymphocytes Relative: 34.7 % (ref 12.0–46.0)
Lymphs Abs: 1.4 10*3/uL (ref 0.7–4.0)
MCHC: 33.7 g/dL (ref 30.0–36.0)
MCV: 85.3 fl (ref 78.0–100.0)
Monocytes Absolute: 0.4 10*3/uL (ref 0.1–1.0)
Monocytes Relative: 11 % (ref 3.0–12.0)
Neutro Abs: 2 10*3/uL (ref 1.4–7.7)
Neutrophils Relative %: 50 % (ref 43.0–77.0)
Platelets: 186 10*3/uL (ref 150.0–400.0)
RBC: 4.3 Mil/uL (ref 3.87–5.11)
RDW: 13.8 % (ref 11.5–15.5)
WBC: 4 10*3/uL (ref 4.0–10.5)

## 2020-08-10 LAB — LIPID PANEL
Cholesterol: 198 mg/dL (ref 0–200)
HDL: 63.1 mg/dL (ref >39.00)
LDL Cholesterol: 122 mg/dL — ABNORMAL HIGH (ref 0–99)
NonHDL: 134.45
Total CHOL/HDL Ratio: 3
Triglycerides: 63 mg/dL (ref 0.0–149.0)
VLDL: 12.6 mg/dL (ref 0.0–40.0)

## 2020-08-10 LAB — TSH: TSH: 1.36 u[IU]/mL (ref 0.35–4.50)

## 2020-08-10 LAB — HEMOGLOBIN A1C: Hgb A1c MFr Bld: 6.2 % (ref 4.6–6.5)

## 2020-08-15 ENCOUNTER — Other Ambulatory Visit: Payer: Self-pay

## 2020-08-16 ENCOUNTER — Encounter: Payer: Medicare Other | Admitting: Family Medicine

## 2020-08-22 ENCOUNTER — Ambulatory Visit: Payer: Medicare Other | Admitting: Family Medicine

## 2020-09-05 ENCOUNTER — Ambulatory Visit (INDEPENDENT_AMBULATORY_CARE_PROVIDER_SITE_OTHER): Payer: Medicare Other | Admitting: Family Medicine

## 2020-09-05 ENCOUNTER — Encounter: Payer: Self-pay | Admitting: Family Medicine

## 2020-09-05 ENCOUNTER — Other Ambulatory Visit: Payer: Self-pay

## 2020-09-05 VITALS — BP 125/81 | HR 64 | Temp 97.5°F | Ht 64.0 in | Wt 128.8 lb

## 2020-09-05 DIAGNOSIS — R053 Chronic cough: Secondary | ICD-10-CM | POA: Diagnosis not present

## 2020-09-05 DIAGNOSIS — R7303 Prediabetes: Secondary | ICD-10-CM | POA: Diagnosis not present

## 2020-09-05 DIAGNOSIS — Z23 Encounter for immunization: Secondary | ICD-10-CM

## 2020-09-05 DIAGNOSIS — Z0000 Encounter for general adult medical examination without abnormal findings: Secondary | ICD-10-CM | POA: Diagnosis not present

## 2020-09-05 MED ORDER — PANTOPRAZOLE SODIUM 40 MG PO TBEC: 40.0000 mg | DELAYED_RELEASE_TABLET | Freq: Every day | ORAL | 3 refills | Status: DC

## 2020-09-05 NOTE — Patient Instructions (Signed)
Health Maintenance, Female Adopting a healthy lifestyle and getting preventive care are important in promoting health and wellness. Ask your health care provider about: The right schedule for you to have regular tests and exams. Things you can do on your own to prevent diseases and keep yourself healthy. What should I know about diet, weight, and exercise? Eat a healthy diet  Eat a diet that includes plenty of vegetables, fruits, low-fat dairy products, and lean protein. Do not eat a lot of foods that are high in solid fats, added sugars, or sodium.  Maintain a healthy weight Body mass index (BMI) is used to identify weight problems. It estimates body fat based on height and weight. Your health care provider can help determineyour BMI and help you achieve or maintain a healthy weight. Get regular exercise Get regular exercise. This is one of the most important things you can do for your health. Most adults should: Exercise for at least 150 minutes each week. The exercise should increase your heart rate and make you sweat (moderate-intensity exercise). Do strengthening exercises at least twice a week. This is in addition to the moderate-intensity exercise. Spend less time sitting. Even light physical activity can be beneficial. Watch cholesterol and blood lipids Have your blood tested for lipids and cholesterol at 68 years of age, then havethis test every 5 years. Have your cholesterol levels checked more often if: Your lipid or cholesterol levels are high. You are older than 68 years of age. You are at high risk for heart disease. What should I know about cancer screening? Depending on your health history and family history, you may need to have cancer screening at various ages. This may include screening for: Breast cancer. Cervical cancer. Colorectal cancer. Skin cancer. Lung cancer. What should I know about heart disease, diabetes, and high blood pressure? Blood pressure and heart  disease High blood pressure causes heart disease and increases the risk of stroke. This is more likely to develop in people who have high blood pressure readings, are of African descent, or are overweight. Have your blood pressure checked: Every 3-5 years if you are 18-39 years of age. Every year if you are 40 years old or older. Diabetes Have regular diabetes screenings. This checks your fasting blood sugar level. Have the screening done: Once every three years after age 40 if you are at a normal weight and have a low risk for diabetes. More often and at a younger age if you are overweight or have a high risk for diabetes. What should I know about preventing infection? Hepatitis B If you have a higher risk for hepatitis B, you should be screened for this virus. Talk with your health care provider to find out if you are at risk forhepatitis B infection. Hepatitis C Testing is recommended for: Everyone born from 1945 through 1965. Anyone with known risk factors for hepatitis C. Sexually transmitted infections (STIs) Get screened for STIs, including gonorrhea and chlamydia, if: You are sexually active and are younger than 68 years of age. You are older than 68 years of age and your health care provider tells you that you are at risk for this type of infection. Your sexual activity has changed since you were last screened, and you are at increased risk for chlamydia or gonorrhea. Ask your health care provider if you are at risk. Ask your health care provider about whether you are at high risk for HIV. Your health care provider may recommend a prescription medicine to help   prevent HIV infection. If you choose to take medicine to prevent HIV, you should first get tested for HIV. You should then be tested every 3 months for as long as you are taking the medicine. Pregnancy If you are about to stop having your period (premenopausal) and you may become pregnant, seek counseling before you get  pregnant. Take 400 to 800 micrograms (mcg) of folic acid every day if you become pregnant. Ask for birth control (contraception) if you want to prevent pregnancy. Osteoporosis and menopause Osteoporosis is a disease in which the bones lose minerals and strength with aging. This can result in bone fractures. If you are 65 years old or older, or if you are at risk for osteoporosis and fractures, ask your health care provider if you should: Be screened for bone loss. Take a calcium or vitamin D supplement to lower your risk of fractures. Be given hormone replacement therapy (HRT) to treat symptoms of menopause. Follow these instructions at home: Lifestyle Do not use any products that contain nicotine or tobacco, such as cigarettes, e-cigarettes, and chewing tobacco. If you need help quitting, ask your health care provider. Do not use street drugs. Do not share needles. Ask your health care provider for help if you need support or information about quitting drugs. Alcohol use Do not drink alcohol if: Your health care provider tells you not to drink. You are pregnant, may be pregnant, or are planning to become pregnant. If you drink alcohol: Limit how much you use to 0-1 drink a day. Limit intake if you are breastfeeding. Be aware of how much alcohol is in your drink. In the [place] one drink equals one 12 oz bottle of beer (355 mL), one 5 oz glass of wine (148 mL), or one 1 oz glass of hard liquor (44 mL). General instructions Schedule regular health, dental, and eye exams. Stay current with your vaccines. Tell your health care provider if: You often feel depressed. You have ever been abused or do not feel safe at home. Summary Adopting a healthy lifestyle and getting preventive care are important in promoting health and wellness. Follow your health care provider's instructions about healthy diet, exercising, and getting tested or screened for diseases. Follow your health care provider's  instructions on monitoring your cholesterol and blood pressure. This information is not intended to replace advice given to you by your health care provider. Make sure you discuss any questions you have with your healthcare provider. Document Revised: 01/27/2018 Document Reviewed: 01/27/2018 Elsevier Patient Education  2022 Elsevier Inc.  

## 2020-09-05 NOTE — Progress Notes (Deleted)
   I, Bonnie [name], LAT, ATC acting as a scribe for Bonnie Leader, MD.  Bonnie [name] is a 70 y.o. female who presents to [facility] today for f/u chronic R heel pain thought to be due to Haglund deformity/chronic calcific tendinopathy.  Pt was last seen by Dr. Georgina Snell on 06/20/20 and was advised to plan for eccentric exercises, nitroglycerin patch protocol, and continue wearing heel left. Since her last visit, pt reports  Dx imaging: 07/06/19 R foot XR  Pertinent review of systems: ***  Relevant historical information: ***   Exam:  There were no vitals taken for this visit. General: Well Developed, well nourished, and in no acute distress.   MSK: ***    Lab and Radiology Results No results found for this or any previous visit (from the past 72 hour(s)). No results found.     Assessment and Plan: 70 y.o. female with ***   PDMP not reviewed this encounter. No orders of the defined types were placed in this encounter.  No orders of the defined types were placed in this encounter.    Discussed warning signs or symptoms. Please see discharge instructions. Patient expresses understanding.   ***

## 2020-09-05 NOTE — Progress Notes (Addendum)
Office Note 09/05/2020  CC:  Chief Complaint  Patient presents with   Annual Exam    CPE/labs.  No concerns.     HPI:  Bonnie [name] is a 70 y.o. White female who is here for annual health maintenance exam. Last visit in office was 06/15/20. A/P as of that visit: "1) R posterior heal pain, chronic: achilles pain/strain associated with posterior calc heal spur. Recommended she see sports med MD as the next step.   2) Chronic cough: I still suspect this is PND/allergic related. Recommended trial of flonase daily for 1 mo to see if response. Pt wanted to take this step rather than pulm referral or CXR at this time."  INTERIM HX: Doing well. Recently visited [place] and this was great.  Her cough still waxes and wanes, seems to be worse hs but not consistently.   Flonase no help.  Antihistamine no help.   Denies feeling any typical GER sx's.  NO nausea, no abd pain or CP.  No wheezing, chest tightness, hemoptysis or SOB.  No wt loss.  Right heel pain signif improved with some exercises and NTG patches rx'd by Dr. Georgina Snell.  I reviewed her recent fasting labs in detail with her today.  Hba1c 6.2%, otherwise all normal.  Past Medical History:  Diagnosis Date   Genital herpes in women 02-17-09   she "plays with" the dose: 1/2-1 tab per day   Haglund's deformity 10/2015   Right; with posterior calcaneal heel spur (Podiatrist)   History of fracture of phalanx of thumb 09/2016   [facility]: left thumb, possible impacted fracture of distal portion of proximal phalanx of thumb.  Splint rx'd and ortho f/u advised.   Lactose intolerance    ?   Multiple allergies 08/02/2010   Osteopenia 2014   2014 DEXA: Lowest tscore - 1.7 at left femoral neck.  07/2017 T-score -2.0 femur neck   Prediabetes 2014   Saw nutritionist (HbA1c 6.1% 02/2014).  A1c 5.7% 08/2015.  A1c 6.0% 08/2017. A1c 6.1% 09/2018.   Tendonitis, Achilles, right 2018   Podiatrist   Vallecular cyst 04/09/15   ? cause of chronic  cough?--Dr. Simeon Craft (ENT) removed this and her cough resolved.    Past Surgical History:  Procedure Laterality Date   broken leg  6 months old   cataract surgery  4-12 and 5-12   COLONOSCOPY  2009; 01/04/18   2009 normal.  Rpt 01/04/18 normal (Dr. Collene Mares).  Recall 10 yrs.   COSMETIC SURGERY  70 yrs old   nose   DEXA  2014; 2019   2014 T-score -1.7.            07/2017 T-score -2.0 femur neck. 09/2019 -2.1.   INCISION AND DRAINAGE  08/11/2016   Epidermal inclusion cyst on back (done by MD at [facility] in [place]).   teeth pulled  70 yrs old   Vallecular cyst excision  2017   cough resolved after this was removed.    Family History  Problem Relation Age of Onset   Heart disease Father        passed after by pass surgery   Hypertension Father    Hyperlipidemia Father    Other Sister        auto immune issues   Gout Sister    Autoimmune disease Sister        fatigue, rashes   Osteopenia Sister    Breast cancer Sister    Osteoporosis Mother     Social  History   Socioeconomic History   Marital status: Married    Spouse name: Not on file   Number of children: Not on file   Years of education: Not on file   Highest education level: Not on file  Occupational History   Not on file  Tobacco Use   Smoking status: Never   Smokeless tobacco: Never  Vaping Use   Vaping Use: Never used  Substance and Sexual Activity   Alcohol use: Yes    Comment: 1-2 beers a week or 1-2 glasses of wine a week   Drug use: No   Sexual activity: Yes    Partners: Male  Other Topics Concern   Not on file  Social History Narrative   Married, 2 grown children.   Lives in [place], [place] from [place].   Occupation: Optician, dispensing   No tob, 1-2 beer/wine per week.  No hx of drug abuse or alcohol probs.   Enjoys outdoors, snow skiing, swimming, running, hiking.   Social Determinants of Health   Financial Resource Strain: Not on file  Food Insecurity: Not on file  Transportation  Needs: Not on file  Physical Activity: Not on file  Stress: Not on file  Social Connections: Not on file  Intimate Partner Violence: Not on file    Outpatient Medications Prior to Visit  Medication Sig Dispense Refill   acyclovir (ZOVIRAX) 400 MG tablet Take 400 mg by mouth 2 (two) times daily.     Calcium Carbonate-Vitamin D (CALCIUM-VITAMIN D) 500-200 MG-UNIT per tablet Take 1 tablet by mouth 2 (two) times daily.     estradiol (ESTRACE) 0.1 MG/GM vaginal cream Place 1 Applicatorful vaginally once a week. Pt uses every other week.     hydrocortisone 2.5 % cream APPLY SPARINGLY TO AFFECTED AREA 2 TO 4 TIMES A DAY  2   multivitamin (THERAGRAN) tablet Take 1 tablet by mouth daily. One a day     nitroGLYCERIN (NITRODUR - DOSED IN MG/24 HR) 0.2 mg/hr patch Apply 1/4 patch daily to tendon for tendonitis. 30 patch 1   No facility-administered medications prior to visit.    Allergies  Allergen Reactions   Cefaclor Rash    Other reaction(s): Unknown    ROS Review of Systems  Constitutional:  Negative for appetite change, chills, fatigue and fever.  HENT:  Negative for congestion, dental problem, ear pain and sore throat.   Eyes:  Negative for discharge, redness and visual disturbance.  Respiratory:  Positive for cough (as per hpi). Negative for chest tightness, shortness of breath and wheezing.   Cardiovascular:  Negative for chest pain, palpitations and leg swelling.  Gastrointestinal:  Negative for abdominal pain, blood in stool, diarrhea, nausea and vomiting.  Genitourinary:  Negative for difficulty urinating, dysuria, flank pain, frequency, hematuria and urgency.  Musculoskeletal:  Negative for arthralgias, back pain, joint swelling, myalgias and neck stiffness.  Skin:  Negative for pallor and rash.  Neurological:  Negative for dizziness, speech difficulty, weakness and headaches.  Hematological:  Negative for adenopathy. Does not bruise/bleed easily.  Psychiatric/Behavioral:   Negative for confusion and sleep disturbance. The patient is not nervous/anxious.    PE; Vitals with BMI 09/05/2020 07/11/2020 06/20/2020  Height 5\' 4"  - 5\' 4"   Weight 128 lbs 13 oz - 130 lbs  BMI 01.0 - 93.2  Systolic 355 - 732  Diastolic 81 - 78  Pulse 64 93 74   EXAM chaperoned by Armandina Gemma, CMA Gen: Alert, well appearing.  Patient is oriented  to person, place, time, and situation. AFFECT: pleasant, lucid thought and speech. ENT: Ears: EACs clear, normal epithelium.  TMs with good light reflex and landmarks bilaterally.  Eyes: no injection, icteris, swelling, or exudate.  EOMI, PERRLA. Nose: no drainage or turbinate edema/swelling.  No injection or focal lesion.  Mouth: lips without lesion/swelling.  Oral mucosa pink and moist.  Dentition intact and without obvious caries or gingival swelling.  Oropharynx without erythema, exudate, or swelling.  Neck: supple/nontender.  No LAD, mass, or TM.  Carotid pulses 2+ bilaterally, without bruits. CV: RRR, no m/r/g.   LUNGS: CTA bilat, nonlabored resps, good aeration in all lung fields. ABD: soft, NT, ND, BS normal.  No hepatospenomegaly or mass.  No bruits. EXT: no clubbing, cyanosis, or edema.  Musculoskeletal: no joint swelling, erythema, warmth, or tenderness.  ROM of all joints intact. Skin - no sores or suspicious lesions or rashes or color changes  Pertinent labs:  Lab Results  Component Value Date   TSH 1.36 08/10/2020   Lab Results  Component Value Date   WBC 4.0 08/10/2020   HGB 12.4 08/10/2020   HCT 36.7 08/10/2020   MCV 85.3 08/10/2020   PLT 186.0 08/10/2020   Lab Results  Component Value Date   CREATININE 0.82 08/10/2020   BUN 20 08/10/2020   NA 138 08/10/2020   K 4.1 08/10/2020   CL 102 08/10/2020   CO2 30 08/10/2020   Lab Results  Component Value Date   ALT 13 08/10/2020   AST 17 08/10/2020   ALKPHOS 73 08/10/2020   BILITOT 0.6 08/10/2020   Lab Results  Component Value Date   CHOL 198 08/10/2020   Lab  Results  Component Value Date   HDL 63.10 08/10/2020   Lab Results  Component Value Date   LDLCALC 122 (H) 08/10/2020   Lab Results  Component Value Date   TRIG 63.0 08/10/2020   Lab Results  Component Value Date   CHOLHDL 3 08/10/2020   Lab Results  Component Value Date   HGBA1C 6.2 08/10/2020   ASSESSMENT AND PLAN:   1) Cough; upper airway cough syndrome suspected.   We've never empirically treated her for the possibility of silent LPR. Will do trial of pantoprazole 40mg  qd for the next month and see how this goes.  2) Prediabetes: a1c stable at 6.2%. She is trying to work on a consistently low carb diet. Plan recheck a1c 1 yr.  3) Health maintenance exam: Reviewed age and gender appropriate health maintenance issues (prudent diet, regular exercise, health risks of tobacco and excessive alcohol, use of seatbelts, fire alarms in home, use of sunscreen).  Also reviewed age and gender appropriate health screening as well as vaccine recommendations. Vaccines: Tdap booster due->rx sent to pharmacy today.  Otherwise ALL UTD. Labs: none today, reviewed recent fasting HP results with pt today. Cervical ca screening: per GYN, she is to schedule for around August. Breast ca screening: per GYN, she is to schedule for around August. Colon ca screening: recall 2029. Osteoporosis screening: next DEXA 2023.  An After Visit Summary was printed and given to the patient.  FOLLOW UP:  Return in about 1 year (around 09/05/2021) for annual CPE (fasting).  Signed:  Crissie Sickles, MD           09/05/2020

## 2020-09-06 ENCOUNTER — Ambulatory Visit: Payer: Medicare Other | Admitting: Family Medicine

## 2020-09-14 NOTE — Progress Notes (Signed)
I, [name], LAT, ATC acting as a scribe for [name], MD.  Secilia Murrill is a 70 y.o. female who presents to [place] at [facility] today for f/u chronic R heel pain  consistent with Haglund deformity/chronic calcific tendinopathy. Pt was seen by podiatry on 10/06/19 and dx w/ Achille's tendonitis and plantar fascitis. Podiatry advised NSAIDs, PT, and topical creams. Pt also has a hx of post calcaneous spurs. Pt was last seen by Dr. Georgina Snell on 06/20/20 and was advised to use nitro patches and do eccentric exercises. Today, pt reports that her heel pain is improving.   Has not been running though due to L knee pain. Pain has been on and off for years. Pain moves around patella. Pain is consistently after her runs and can occur at beginning of run. Last run was Tuesday of last week. Has been swimming and is unable to perform whip kick portion of breast stroke. Patient ices knee and uses Biofreeze for pain relief.   Dx imaging: 07/05/20 R foot XR   Pertinent review of systems: No fevers or chills  Relevant historical information: Physically fit and active.   Exam:  BP 122/82   Pulse 84   Ht '5\' 4"'$  (1.626 m)   Wt 128 lb (58.1 kg)   SpO2 97%   BMI 21.97 kg/m  General: Well Developed, well nourished, and in no acute distress.   MSK: Left knee normal-appearing Normal motion with retropatellar crepitation. Stable ligamentous exam. Intact strength. Mildly positive medial McMurray's test. Intact strength.    Lab and Radiology Results  Diagnostic Limited MSK Ultrasound of: Left knee  Quad tendon intact.  Small hyperechoic structure at superior patellar pole within tendon consistent with calcific tendinopathy. No significant joint effusion. Patellar tendon normal. Medial joint line slightly narrowed otherwise normal. Lateral joint line normal. Posterior knee no Baker's cyst. Impression: Calcific quad tendinitis  X-ray images left knee obtained today personally  and independently interpreted Small osteophyte superior patellar pole. MIld DJD. No acute fracture.  Await formal radiology review    Assessment and Plan: 70 y.o. female with left knee pain.  Pain thought to be more related to patellofemoral pain and patellofemoral chondromalacia type pain then due to the small amount of insertional quad calcific tendinitis seen on ultrasound and x-ray.  Plan for PT and quad strengthening exercises.  The heel pain has significantly improved.  Plan to finish out course of nitroglycerin patch protocol and continue home exercise program.   PDMP not reviewed this encounter. Orders Placed This Encounter  Procedures   [place] LIMITED JOINT SPACE STRUCTURES LOW RIGHT(NO LINKED CHARGES)    Order Specific Question:   Reason for Exam (SYMPTOM  OR DIAGNOSIS REQUIRED)    Answer:   right heel pain    Order Specific Question:   Preferred imaging location?    Answer:   [place]   DG Knee AP/LAT W/[place] Left    Standing Status:   Future    Number of Occurrences:   1    Standing Expiration Date:   09/17/2021    Order Specific Question:   Reason for Exam (SYMPTOM  OR DIAGNOSIS REQUIRED)    Answer:   left knee pain    Order Specific Question:   Preferred imaging location?    Answer:   [facility]   Ambulatory referral to Physical Therapy    Referral Priority:   Routine    Referral Type:   Physical Medicine  Referral Reason:   Specialty Services Required    Requested Specialty:   Physical Therapy    Number of Visits Requested:   1   No orders of the defined types were placed in this encounter.    Discussed warning signs or symptoms. Please see discharge instructions. Patient expresses understanding.   The above documentation has been reviewed and is accurate and complete [name], M.D.

## 2020-09-17 ENCOUNTER — Other Ambulatory Visit: Payer: Self-pay

## 2020-09-17 ENCOUNTER — Ambulatory Visit (INDEPENDENT_AMBULATORY_CARE_PROVIDER_SITE_OTHER): Payer: Medicare Other

## 2020-09-17 ENCOUNTER — Ambulatory Visit: Payer: Medicare Other | Admitting: Family Medicine

## 2020-09-17 ENCOUNTER — Ambulatory Visit: Payer: Self-pay

## 2020-09-17 VITALS — BP 122/82 | HR 84 | Ht 64.0 in | Wt 128.0 lb

## 2020-09-17 DIAGNOSIS — M79671 Pain in right foot: Secondary | ICD-10-CM | POA: Diagnosis not present

## 2020-09-17 DIAGNOSIS — G8929 Other chronic pain: Secondary | ICD-10-CM | POA: Diagnosis not present

## 2020-09-17 DIAGNOSIS — M25562 Pain in left knee: Secondary | ICD-10-CM

## 2020-09-17 NOTE — Patient Instructions (Addendum)
Thank you for coming in today.   Please get an Xray today before you leave   I've referred you to Physical Therapy.  Let us know if you don't hear from them in one week.   Please use Voltaren gel (Generic Diclofenac Gel) up to 4x daily for pain as needed.  This is available over-the-counter as both the name brand Voltaren gel and the generic diclofenac gel.    Recheck as needed if not improving.

## 2020-09-19 NOTE — Progress Notes (Signed)
Left knee x-ray shows some swelling in the knee joint.

## 2020-10-02 ENCOUNTER — Other Ambulatory Visit: Payer: Self-pay | Admitting: Obstetrics and Gynecology

## 2020-10-02 DIAGNOSIS — M25562 Pain in left knee: Secondary | ICD-10-CM | POA: Diagnosis not present

## 2020-10-02 DIAGNOSIS — Z1231 Encounter for screening mammogram for malignant neoplasm of breast: Secondary | ICD-10-CM

## 2020-10-02 DIAGNOSIS — M79671 Pain in right foot: Secondary | ICD-10-CM | POA: Diagnosis not present

## 2020-10-08 ENCOUNTER — Telehealth (INDEPENDENT_AMBULATORY_CARE_PROVIDER_SITE_OTHER): Payer: Medicare Other | Admitting: Family Medicine

## 2020-10-08 ENCOUNTER — Encounter: Payer: Self-pay | Admitting: Family Medicine

## 2020-10-08 DIAGNOSIS — R053 Chronic cough: Secondary | ICD-10-CM

## 2020-10-08 NOTE — Progress Notes (Signed)
Virtual Visit via Video Note  I connected with Bonnie [name] on 10/08/20 at  1:00 PM EDT by a video enabled telemedicine application and verified that I am speaking with the correct person using two identifiers.  Location patient: home, [place] Location provider:work or home office Persons participating in the virtual visit: patient, provider  I discussed the limitations of evaluation and management by telemedicine and the availability of in person appointments. The patient expressed understanding and agreed to proceed.   HPI: 70 y/o WF being seen today for 1 mo f/u cough. A/P as of last visit: "1) Cough; upper airway cough syndrome suspected.   We've never empirically treated her for the possibility of silent LPR. Will do trial of pantoprazole '40mg'$  qd for the next month and see how this goes.   2) Prediabetes: a1c stable at 6.2%. She is trying to work on a consistently low carb diet. Plan recheck a1c 1 yr.   3) Health maintenance exam: Reviewed age and gender appropriate health maintenance issues (prudent diet, regular exercise, health risks of tobacco and excessive alcohol, use of seatbelts, fire alarms in home, use of sunscreen).  Also reviewed age and gender appropriate health screening as well as vaccine recommendations. Vaccines: Tdap booster due->rx sent to pharmacy today.  Otherwise ALL UTD. Labs: none today, reviewed recent fasting HP results with pt today. Cervical ca screening: per GYN, she is to schedule for around August. Breast ca screening: per GYN, she is to schedule for around August. Colon ca screening: recall 2029. Osteoporosis screening: next DEXA 2023."  INTERIM HX: She says the cough did improved a little in first few weeks on pantoprazole. Then it got a bit worse again recently.  Historically this cough has behaved in this way (x years). No hoarse voice, no voice fatigue.  No ST, no SOB or wheeze. No wt loss, no f/c/malaise.  She had a vallecular cyst about 5 yrs ago and  she got it excised by ENT and pt not sure if this helped her cough temporarily or not.      ROS: See pertinent positives and negatives per HPI.  Past Medical History:  Diagnosis Date   Genital herpes in women 02-17-09   she "plays with" the dose: 1/2-1 tab per day   Haglund's deformity 10/2015   Right; with posterior calcaneal heel spur (Podiatrist)   History of fracture of phalanx of thumb 09/2016   [facility]: left thumb, possible impacted fracture of distal portion of proximal phalanx of thumb.  Splint rx'd and ortho f/u advised.   Lactose intolerance    ?   Multiple allergies 08/02/2010   Osteopenia 2014   2014 DEXA: Lowest tscore - 1.7 at left femoral neck.  07/2017 T-score -2.0 femur neck   Prediabetes 2014   Saw nutritionist (HbA1c 6.1% 02/2014).  A1c 5.7% 08/2015.  A1c 6.0% 08/2017. A1c 6.1% 09/2018.   Tendonitis, Achilles, right 2018   Podiatrist   Vallecular cyst 04/09/15   ? cause of chronic cough?--Dr. Simeon Craft (ENT) removed this and her cough resolved.    Past Surgical History:  Procedure Laterality Date   broken leg  6 months old   cataract surgery  4-12 and 5-12   COLONOSCOPY  2009; 01/04/18   2009 normal.  Rpt 01/04/18 normal (Dr. Collene Mares).  Recall 10 yrs.   COSMETIC SURGERY  70 yrs old   nose   DEXA  2014; 2019   2014 T-score -1.7.            07/2017  T-score -2.0 femur neck. 09/2019 -2.1.   INCISION AND DRAINAGE  08/11/2016   Epidermal inclusion cyst on back (done by MD at [facility] in [place]).   teeth pulled  70 yrs old   Vallecular cyst excision  2017   cough resolved after this was removed.     Current Outpatient Medications:    acyclovir (ZOVIRAX) 400 MG tablet, Take 400 mg by mouth 2 (two) times daily., Disp: , Rfl:    Calcium Carbonate-Vitamin D (CALCIUM-VITAMIN D) 500-200 MG-UNIT per tablet, Take 1 tablet by mouth 2 (two) times daily., Disp: , Rfl:    estradiol (ESTRACE) 0.1 MG/GM vaginal cream, Place 1 Applicatorful vaginally once a week. Pt  uses every other week., Disp: , Rfl:    hydrocortisone 2.5 % cream, APPLY SPARINGLY TO AFFECTED AREA 2 TO 4 TIMES A DAY, Disp: , Rfl: 2   multivitamin (THERAGRAN) tablet, Take 1 tablet by mouth daily. One a day, Disp: , Rfl:    nitroGLYCERIN (NITRODUR - DOSED IN MG/24 HR) 0.2 mg/hr patch, Apply 1/4 patch daily to tendon for tendonitis., Disp: 30 patch, Rfl: 1   pantoprazole (PROTONIX) 40 MG tablet, Take 1 tablet (40 mg total) by mouth daily., Disp: 30 tablet, Rfl: 3  EXAM:  VITALS per patient if applicable:  Vitals with BMI 09/17/2020 09/05/2020 07/11/2020  Height '5\' 4"'$  '5\' 4"'$  -  Weight 128 lbs 128 lbs 13 oz -  BMI 99991111 123XX123 -  Systolic 123XX123 0000000 -  Diastolic 82 81 -  Pulse 84 64 93     GENERAL: alert, oriented, appears well and in no acute distress  HEENT: atraumatic, conjunttiva clear, no obvious abnormalities on inspection of external nose and ears  NECK: normal movements of the head and neck  LUNGS: on inspection no signs of respiratory distress, breathing rate appears normal, no obvious gross SOB, gasping or wheezing  CV: no obvious cyanosis  MS: moves all visible extremities without noticeable abnormality  PSYCH/NEURO: pleasant and cooperative, no obvious depression or anxiety, speech and thought processing grossly intact    Chemistry      Component Value Date/Time   NA 138 08/10/2020 0855   K 4.1 08/10/2020 0855   CL 102 08/10/2020 0855   CO2 30 08/10/2020 0855   BUN 20 08/10/2020 0855   CREATININE 0.82 08/10/2020 0855      Component Value Date/Time   CALCIUM 9.7 08/10/2020 0855   ALKPHOS 73 08/10/2020 0855   AST 17 08/10/2020 0855   ALT 13 08/10/2020 0855   BILITOT 0.6 08/10/2020 0855     ASSESSMENT AND PLAN:  Discussed the following assessment and plan:  Chronic cough.  I suspect this is upper airway cough syndrome. She has tried nasal steroid, antihistamine, and most recently daily PPI---all with no appreciable lasting effect on her cough.  She wants to try  a 1 mo period off pantoprazole to see if cough returns more. If not, then we have decided that an ENT eval would be the next logical step for consideration of re-look at epiglottis  and laryngeal areas. I discussed the assessment and treatment plan with the patient. The patient was provided an opportunity to ask questions and all were answered. The patient agreed with the plan and demonstrated an understanding of the instructions.   Spent 11 min with pt today reviewing HPI, reviewing relevant past history, doing exam, reviewing and discussing lab and imaging data, and formulating plans.  F/u: as needed  Signed:  Crissie Sickles,  MD           10/08/2020

## 2020-10-09 DIAGNOSIS — M79671 Pain in right foot: Secondary | ICD-10-CM | POA: Diagnosis not present

## 2020-10-09 DIAGNOSIS — M25562 Pain in left knee: Secondary | ICD-10-CM | POA: Diagnosis not present

## 2020-10-11 ENCOUNTER — Other Ambulatory Visit: Payer: Self-pay

## 2020-10-11 ENCOUNTER — Ambulatory Visit
Admission: RE | Admit: 2020-10-11 | Discharge: 2020-10-11 | Disposition: A | Discharge status: Home / Self Care | Payer: Medicare Other | Source: Ambulatory Visit | Attending: Obstetrics and Gynecology | Admitting: Obstetrics and Gynecology

## 2020-10-11 DIAGNOSIS — Z1231 Encounter for screening mammogram for malignant neoplasm of breast: Secondary | ICD-10-CM

## 2020-10-12 DIAGNOSIS — M25562 Pain in left knee: Secondary | ICD-10-CM | POA: Diagnosis not present

## 2020-10-12 DIAGNOSIS — M79671 Pain in right foot: Secondary | ICD-10-CM | POA: Diagnosis not present

## 2020-10-16 DIAGNOSIS — M79671 Pain in right foot: Secondary | ICD-10-CM | POA: Diagnosis not present

## 2020-10-16 DIAGNOSIS — M25562 Pain in left knee: Secondary | ICD-10-CM | POA: Diagnosis not present

## 2020-10-19 DIAGNOSIS — M25562 Pain in left knee: Secondary | ICD-10-CM | POA: Diagnosis not present

## 2020-10-19 DIAGNOSIS — M79671 Pain in right foot: Secondary | ICD-10-CM | POA: Diagnosis not present

## 2020-10-30 DIAGNOSIS — M25562 Pain in left knee: Secondary | ICD-10-CM | POA: Diagnosis not present

## 2020-10-30 DIAGNOSIS — M79671 Pain in right foot: Secondary | ICD-10-CM | POA: Diagnosis not present

## 2020-11-02 DIAGNOSIS — M25562 Pain in left knee: Secondary | ICD-10-CM | POA: Diagnosis not present

## 2020-11-02 DIAGNOSIS — M79671 Pain in right foot: Secondary | ICD-10-CM | POA: Diagnosis not present

## 2020-11-05 DIAGNOSIS — M79671 Pain in right foot: Secondary | ICD-10-CM | POA: Diagnosis not present

## 2020-11-05 DIAGNOSIS — M25562 Pain in left knee: Secondary | ICD-10-CM | POA: Diagnosis not present

## 2020-11-07 DIAGNOSIS — M25562 Pain in left knee: Secondary | ICD-10-CM | POA: Diagnosis not present

## 2020-11-07 DIAGNOSIS — M79671 Pain in right foot: Secondary | ICD-10-CM | POA: Diagnosis not present

## 2020-11-09 DIAGNOSIS — M25562 Pain in left knee: Secondary | ICD-10-CM | POA: Diagnosis not present

## 2020-11-09 DIAGNOSIS — M79671 Pain in right foot: Secondary | ICD-10-CM | POA: Diagnosis not present

## 2020-11-14 DIAGNOSIS — M79671 Pain in right foot: Secondary | ICD-10-CM | POA: Diagnosis not present

## 2020-11-14 DIAGNOSIS — M25562 Pain in left knee: Secondary | ICD-10-CM | POA: Diagnosis not present

## 2020-11-19 DIAGNOSIS — M25562 Pain in left knee: Secondary | ICD-10-CM | POA: Diagnosis not present

## 2020-11-19 DIAGNOSIS — M79671 Pain in right foot: Secondary | ICD-10-CM | POA: Diagnosis not present

## 2020-11-21 DIAGNOSIS — M25562 Pain in left knee: Secondary | ICD-10-CM | POA: Diagnosis not present

## 2020-11-21 DIAGNOSIS — M79671 Pain in right foot: Secondary | ICD-10-CM | POA: Diagnosis not present

## 2020-11-23 DIAGNOSIS — M79671 Pain in right foot: Secondary | ICD-10-CM | POA: Diagnosis not present

## 2020-11-23 DIAGNOSIS — M25562 Pain in left knee: Secondary | ICD-10-CM | POA: Diagnosis not present

## 2020-12-04 DIAGNOSIS — M25562 Pain in left knee: Secondary | ICD-10-CM | POA: Diagnosis not present

## 2020-12-04 DIAGNOSIS — M79671 Pain in right foot: Secondary | ICD-10-CM | POA: Diagnosis not present

## 2020-12-07 DIAGNOSIS — M25562 Pain in left knee: Secondary | ICD-10-CM | POA: Diagnosis not present

## 2020-12-07 DIAGNOSIS — M79671 Pain in right foot: Secondary | ICD-10-CM | POA: Diagnosis not present

## 2021-01-07 DIAGNOSIS — L821 Other seborrheic keratosis: Secondary | ICD-10-CM | POA: Diagnosis not present

## 2021-01-07 DIAGNOSIS — L814 Other melanin hyperpigmentation: Secondary | ICD-10-CM | POA: Diagnosis not present

## 2021-01-07 DIAGNOSIS — Z872 Personal history of diseases of the skin and subcutaneous tissue: Secondary | ICD-10-CM | POA: Diagnosis not present

## 2021-01-07 DIAGNOSIS — D225 Melanocytic nevi of trunk: Secondary | ICD-10-CM | POA: Diagnosis not present

## 2021-01-14 ENCOUNTER — Telehealth: Payer: Self-pay | Admitting: Family Medicine

## 2021-01-14 NOTE — Telephone Encounter (Signed)
Spoke with spouse he requested that I call patient back this afternoon

## 2021-01-17 ENCOUNTER — Ambulatory Visit: Payer: Self-pay

## 2021-01-17 ENCOUNTER — Ambulatory Visit: Payer: Medicare Other | Admitting: Family Medicine

## 2021-01-17 ENCOUNTER — Other Ambulatory Visit: Payer: Self-pay

## 2021-01-17 VITALS — BP 112/76 | HR 79 | Ht 64.0 in | Wt 123.2 lb

## 2021-01-17 DIAGNOSIS — G8929 Other chronic pain: Secondary | ICD-10-CM

## 2021-01-17 DIAGNOSIS — M25562 Pain in left knee: Secondary | ICD-10-CM

## 2021-01-17 DIAGNOSIS — M79671 Pain in right foot: Secondary | ICD-10-CM | POA: Diagnosis not present

## 2021-01-17 NOTE — Patient Instructions (Addendum)
Thank you for coming in today.   You should hear from MRI scheduling within 1 week. If you do not hear please let me know.    Keeping working on the home exercises  Recheck back with me after MRI

## 2021-01-17 NOTE — Progress Notes (Signed)
I, [name], LAT, ATC acting as a scribe for [name], MD.  Bonnie [name] is a 70 y.o. female who presents to [place] at [facility] today for f/u chronic R heel pain consistent with Haglund deformity/chronic calcific tendinopathy and chronic L knee pain. Pt was last seen by Dr. Georgina Snell on 09/17/20 and for knee pain, pt was referred to [facility] PT and for heel pain pt was advised to finish course of nitro patches and cont HEP. Today, pt reports L knee pain remains the same. Pt notes she will experience pain w/ rotational movements and walking on uneven surfaces. Pt reports the R heel is mostly better, but mostly due to inactivity. Pt has been trying to eat an anti-inflammatory diet, which hasn't fixed her pain, but has made some improvement.  Her goal is to return to running or at least hiking and she has not been able to do this with extensive trials of conservative management since May 2022 including physical therapy and home exercise program.  Dx imaging: 09/17/20 L knee XR 07/05/20 R foot XR   Pertinent review of systems: No fevers or chills  Relevant historical information: Osteopenia   Exam:  BP 112/76   Pulse 79   Ht 5\' 4"  (1.626 m)   Wt 123 lb 3.2 oz (55.9 kg)   SpO2 99%   BMI 21.15 kg/m  General: Well Developed, well nourished, and in no acute distress.   MSK: Right foot tender palpation minimally at posterior calcaneus and Achilles tendon insertion.  Normal foot and ankle motion.  Intact strength.  Left knee normal-appearing Mildly tender palpation at superior patellar pole and at patellar tendon insertion onto proximal tibial tubercle. Normal knee motion. Intact strength some pain with resisted knee extension. Stable ligamentous exam. Minimally positive McMurray's test.     Lab and Radiology Results  Diagnostic Limited MSK Ultrasound of: Left knee and right heel Quad tendon normal-appearing until insertion were small amount of hyperechoic  change consistent with osteophyte present at the superior patella present. Patellar tendon normal-appearing Medial joint line slightly narrowed.  Lateral joint line normal-appearing Posterior knee normal-appearing Right calcaneus Achilles tendon normal-appearing until insertion where posterior calcaneal osteophyte present with increased calcific change. Impression:  Left knee: Superior patella osteophyte. Right calcaneus: Heel spur.   EXAM: LEFT KNEE 3 VIEWS   COMPARISON:  None.   FINDINGS: No evidence of an acute fracture or dislocation. No evidence of arthropathy or other focal bone abnormality. A very small joint effusion is noted.   IMPRESSION: Very small joint effusion without an acute osseous abnormality.     Electronically Signed   By: Virgina Norfolk M.D.   On: 09/18/2020 20:30 I, [name], personally (independently) visualized and performed the interpretation of the images attached in this note.   X-ray right foot obtained May 2021 shows posterior calcaneal osteophyte.   Assessment and Plan: 70 y.o. female with  Persistent left knee pain failing conservative management.  Pain today at distal patellar tendon and distal quad tendon.  Additionally she has some more internal knee pain that is harder to quantify.  She has been unable to return to her normal level of activity despite extensive conservative management strategies.  Plan for MRI to further characterize source of pain and for potential treatment planning including injection.  Right heel pain.  Insertional calcific Achilles tendinitis.  Reinforce eccentric exercise program.  Check after MRI.   PDMP not reviewed this encounter. Orders Placed This Encounter  Procedures   [place] LIMITED JOINT SPACE STRUCTURES LOW LEFT(NO LINKED CHARGES)    Standing Status:   Future    Number of Occurrences:   1    Standing Expiration Date:   07/18/2021    Order Specific Question:   Reason for Exam (SYMPTOM  OR DIAGNOSIS  REQUIRED)    Answer:   left knee pain    Order Specific Question:   Preferred imaging location?    Answer:   [place]   MR Knee Left  Wo Contrast    Standing Status:   Future    Standing Expiration Date:   01/17/2022    Order Specific Question:   What is the patient's sedation requirement?    Answer:   No Sedation    Order Specific Question:   Does the patient have a pacemaker or implanted devices?    Answer:   No    Order Specific Question:   Preferred imaging location?    Answer:   [facility] development [facility] (table limit-350lbs)   No orders of the defined types were placed in this encounter.    Discussed warning signs or symptoms. Please see discharge instructions. Patient expresses understanding.   The above documentation has been reviewed and is accurate and complete [name], M.D.

## 2021-01-24 ENCOUNTER — Ambulatory Visit: Payer: Medicare Other

## 2021-01-24 ENCOUNTER — Encounter: Payer: Self-pay | Admitting: Podiatry

## 2021-01-24 ENCOUNTER — Other Ambulatory Visit: Payer: Self-pay

## 2021-01-24 ENCOUNTER — Ambulatory Visit: Payer: Medicare Other | Admitting: Podiatry

## 2021-01-24 DIAGNOSIS — M722 Plantar fascial fibromatosis: Secondary | ICD-10-CM | POA: Diagnosis not present

## 2021-01-24 DIAGNOSIS — M7661 Achilles tendinitis, right leg: Secondary | ICD-10-CM

## 2021-01-24 DIAGNOSIS — M79672 Pain in left foot: Secondary | ICD-10-CM

## 2021-01-24 DIAGNOSIS — M79671 Pain in right foot: Secondary | ICD-10-CM

## 2021-01-24 NOTE — Progress Notes (Signed)
Subjective:   Patient ID: Bonnie [name], female   DOB: 70 y.o.   MRN: [id number]   HPI Patient presents stating that her heel has improved quite a bit and the discomfort in the back is also improving   ROS      Objective:  Physical Exam  Neurovascular status intact with patient's heel found to be improved with mild discomfort upon deep palpation     Assessment:  Acute Planter fasciitis improved     Plan:  Reviewed condition recommended continuation of anti-inflammatories physical therapy and stretching exercises and at this time I am discharging but encouraged her to call come in with questions concerns which may arise

## 2021-01-24 NOTE — Progress Notes (Signed)
SITUATION Reason for Consult: Evaluation for Bilateral Custom Foot Orthoses Patient / Caregiver Report: Patient has existing orthotics and would like a second set  OBJECTIVE DATA: Patient History / Diagnosis: Plantar fasciitis  Achilles tendinitis of right lower extremity  Pain in right foot  Pain in left foot  Current or Previous Devices:    Foot Examination: Skin presentation:   Intact Ulcers & Callousing:   None and no history Toe / Foot Deformities:  Pes Cavus Weight Bearing Presentation:  cavus Sensation:    Intact  ORTHOTIC RECOMMENDATION Recommended Device: 1x pair of custom functional foot orthoses  GOALS OF ORTHOSES - Reduce Pain - Prevent Foot Deformity - Prevent Progression of Further Foot Deformity - Relieve Pressure - Improve the Overall Biomechanical Function of the Foot and Lower Extremity.  ACTIONS PERFORMED Patient was casted for Foot Orthoses via crush box. Procedure was explained and patient tolerated procedure well. All questions were answered and concerns addressed.  PLAN Potential out of pocket cost was communicated to patient. Casts are to be sent to [facility] for fabrication. Patient is to be called for fitting when devices are ready.

## 2021-01-26 ENCOUNTER — Ambulatory Visit (INDEPENDENT_AMBULATORY_CARE_PROVIDER_SITE_OTHER): Payer: Medicare Other

## 2021-01-26 ENCOUNTER — Other Ambulatory Visit: Payer: Self-pay

## 2021-01-26 DIAGNOSIS — M25562 Pain in left knee: Secondary | ICD-10-CM | POA: Diagnosis not present

## 2021-01-26 DIAGNOSIS — G8929 Other chronic pain: Secondary | ICD-10-CM

## 2021-01-28 NOTE — Progress Notes (Signed)
Left knee MRI shows mild knee arthritis.  Small Baker's cyst is present.  Degenerative meniscus tear is present at the posterior medial meniscus.  Recommend return to clinic in the near future to go over the results in full detail discussed treatment plan and options.

## 2021-01-29 ENCOUNTER — Other Ambulatory Visit: Payer: Self-pay

## 2021-01-29 ENCOUNTER — Ambulatory Visit: Payer: Medicare Other | Admitting: Family Medicine

## 2021-01-29 VITALS — BP 118/76 | HR 69 | Ht 64.0 in | Wt 124.6 lb

## 2021-01-29 DIAGNOSIS — M25562 Pain in left knee: Secondary | ICD-10-CM | POA: Diagnosis not present

## 2021-01-29 DIAGNOSIS — G8929 Other chronic pain: Secondary | ICD-10-CM | POA: Diagnosis not present

## 2021-01-29 NOTE — Patient Instructions (Addendum)
Thank you for coming in today.   Recheck as needed.   I can do an injection at any time.

## 2021-01-29 NOTE — Progress Notes (Signed)
I, [name], LAT, ATC acting as a scribe for [name], MD.  Bonnie [name] is a 70 y.o. female who presents to [place] at [facility] today for  f/u chronic R heel pain consistent with Haglund deformity/chronic calcific tendinopathy and chronic L knee pain w/ MRI review. Pt was last seen by Dr. Georgina Snell on 01/17/21 and was advised to proceed to MRI to further characterize the cause of pain. Additional reinforcement was provided to cont eccentric HEP. Today, pt reports L knee has been feeling somewhat better. Pt notes this is her 5th week of the anti-inflammatory diet. Pt has been running 3 times and is doing cycles of walking/running.  She is leaving in a few weeks for a several week trip to [place] then to [place] then to [place].  Dx imaging: 01/26/21 L knee MRI 09/17/20 L knee XR 07/05/20 R foot XR   Pertinent review of systems: No fevers or chills  Relevant historical information: On estradiol.   Exam:  BP 118/76   Pulse 69   Ht 5\' 4"  (1.626 m)   Wt 124 lb 9.6 oz (56.5 kg)   SpO2 99%   BMI 21.39 kg/m  General: Well Developed, well nourished, and in no acute distress.   MSK: Left knee normal motion.    Lab and Radiology Results No results found for this or any previous visit (from the past 72 hour(s)). MR Knee Left  Wo Contrast  Result Date: 01/27/2021 CLINICAL DATA:  Anterior left knee pain for 6 months. No known injury. Knee pain, chronic, positive xray (Age >= 5y) EXAM: MRI OF THE LEFT KNEE WITHOUT CONTRAST TECHNIQUE: Multiplanar, multisequence MR imaging of the knee was performed. No intravenous contrast was administered. COMPARISON:  X-ray 09/17/2020 FINDINGS: MENISCI Medial meniscus: Intrasubstance degeneration with low-grade free edge and undersurface tearing of the posterior horn. Tiny free edge flap near the posterior root attachment site. Lateral meniscus:  Intact. LIGAMENTS Cruciates:  Intact ACL and PCL. Collaterals: Medial collateral ligament  is intact. Lateral collateral ligament complex is intact. CARTILAGE Patellofemoral: Mild chondral thinning of the lateral patellar facet and patellar apex. Medial: Mild chondral thinning of the weight-bearing medial compartment. Lateral:  No chondral defect. Joint: Physiologic amount of joint fluid without significant effusion. Fat pads within normal limits. Small intra-articular cystic structure at the posterior joint line measuring 17 x 8 mm, likely ganglion cyst. Additional small ganglion along the posterior margin of the PCL. Popliteal Fossa:  Small Baker's cyst.  Intact popliteus tendon. Extensor Mechanism:  Intact quadriceps tendon and patellar tendon. Bones: No acute fracture. No malalignment. Minimal reactive subchondral marrow signal changes at the periphery of the medial tibial plateau. Intraosseous cystic structure closely approximating the PCL attachment site within the posterior aspect of the tibia, likely reflecting an intraosseous ganglion. No suspicious marrow replacing bone lesion. Other: Mild pes anserine bursal edema. Faint intramuscular edema within the lateral head of the gastrocnemius and soleus muscles. IMPRESSION: 1. Intrasubstance degeneration with low-grade free edge and undersurface tearing of the posterior horn of the medial meniscus. Tiny free edge flap near the posterior root attachment site. 2. Mild medial and patellofemoral compartment osteoarthritis. 3. Small Baker's cyst. 4. Faint intramuscular edema within the lateral head of the gastrocnemius and soleus muscles, which may reflect muscle strain. Electronically Signed   By: Davina Poke D.O.   On: 01/27/2021 09:59    I, [name], personally (independently) visualized and performed the interpretation of the images attached in this note.  Assessment and Plan: 69 y.o. female with left knee pain.  Dominant source of pain is the DJD.  She does have a degenerative meniscus tear as well that is probably contributory.   Discussed options.  Plan for continued home exercise program and topical Voltaren gel and intermittent oral NSAIDs.  Certainly steroid injection or hyaluronic acid injection in the future could be used.  We discussed treatment plan and options going forward.  She will let me know when she is ready to proceed with more treatment.    Discussed warning signs or symptoms. Please see discharge instructions. Patient expresses understanding.   The above documentation has been reviewed and is accurate and complete [name], M.D.

## 2021-02-20 ENCOUNTER — Ambulatory Visit: Payer: Medicare Other

## 2021-02-27 ENCOUNTER — Telehealth: Payer: Self-pay | Admitting: Podiatry

## 2021-02-27 NOTE — Telephone Encounter (Signed)
Orthotics in.. lvm for pt to call to schedule an appt to pick them up. °

## 2021-03-18 ENCOUNTER — Other Ambulatory Visit: Payer: Self-pay

## 2021-03-18 ENCOUNTER — Encounter: Payer: Self-pay | Admitting: Family Medicine

## 2021-03-18 ENCOUNTER — Ambulatory Visit: Payer: Self-pay

## 2021-03-18 ENCOUNTER — Ambulatory Visit (INDEPENDENT_AMBULATORY_CARE_PROVIDER_SITE_OTHER): Payer: Medicare Other

## 2021-03-18 ENCOUNTER — Ambulatory Visit: Payer: Medicare Other | Admitting: Family Medicine

## 2021-03-18 VITALS — BP 120/74 | HR 67 | Ht 64.0 in | Wt 123.6 lb

## 2021-03-18 DIAGNOSIS — M722 Plantar fascial fibromatosis: Secondary | ICD-10-CM

## 2021-03-18 DIAGNOSIS — G8929 Other chronic pain: Secondary | ICD-10-CM

## 2021-03-18 DIAGNOSIS — M25562 Pain in left knee: Secondary | ICD-10-CM

## 2021-03-18 DIAGNOSIS — M7661 Achilles tendinitis, right leg: Secondary | ICD-10-CM

## 2021-03-18 NOTE — Progress Notes (Signed)
SITUATION: Reason for Visit: Fitting and Delivery of Custom Fabricated Foot Orthoses Patient Report: Patient reports comfort and is satisfied with device.  OBJECTIVE DATA: Patient History / Diagnosis:     ICD-10-CM   1. Plantar fasciitis  M72.2     2. Achilles tendinitis of right lower extremity  M76.61       Provided Device:  Custom Functional Foot Orthotics     [facility]: [id number]  GOAL OF ORTHOSIS - Improve gait - Decrease energy expenditure - Improve Balance - Provide Triplanar stability of foot complex - Facilitate motion  ACTIONS PERFORMED Patient was fit with foot orthotics trimmed to shoe last. Patient tolerated fittign procedure.   Patient was provided with verbal and written instruction and demonstration regarding donning, doffing, wear, care, proper fit, function, purpose, cleaning, and use of the orthosis and in all related precautions and risks and benefits regarding the orthosis.  Patient was also provided with verbal instruction regarding how to report any failures or malfunctions of the orthosis and necessary follow up care. Patient was also instructed to contact our office regarding any change in status that may affect the function of the orthosis.  Patient demonstrated independence with proper donning, doffing, and fit and verbalized understanding of all instructions.  PLAN: Patient is to follow up in one week or as necessary (PRN). All questions were answered and concerns addressed. Plan of care was discussed with and agreed upon by the patient.

## 2021-03-18 NOTE — Progress Notes (Signed)
I, Wendy Poet, LAT, ATC, am serving as scribe for Dr. [name].  Bonnie [name] is a 71 y.o. female who presents to [place] at [facility] today for continued chronic L knee pain and a new compliant, L hip pain. Pt was last seen by Dr. Georgina Snell on 01/29/21 and was advised to cont HEP, Voltaren gel, and intermittent oral NSAIDs. Today, pt reports that she's been doing pretty well.  She's trying to resume running so she did a walking/running interval w/ 6, 1 min runs.  She is interested in doing a knee injection prior to leaving for a ski trip on Wed.  She is also having L lateral hip pain and feels like the 2 may be related.  Dx imaging: 01/26/21 L knee MRI 09/17/20 L knee XR  Pertinent review of systems: No fevers or chills  Relevant historical information: Mallet thumb   Exam:  BP 120/74 (BP Location: Left Arm, Patient Position: Sitting, Cuff Size: Normal)    Pulse 67    Ht 5\' 4"  (1.626 m)    Wt 123 lb 9.6 oz (56.1 kg)    SpO2 98%    BMI 21.22 kg/m  General: Well Developed, well nourished, and in no acute distress.   MSK: Left knee mild effusion normal-appearing otherwise. Normal motion with crepitation. Mildly tender palpation medial joint line.    Lab and Radiology Results  Procedure: Real-time Ultrasound Guided Injection of left knee superior lateral patellar space Device: Philips Affiniti 50G Images permanently stored and available for review in PACS Verbal informed consent obtained.  Discussed risks and benefits of procedure. Warned about infection bleeding damage to structures skin hypopigmentation and fat atrophy among others. Patient expresses understanding and agreement Time-out conducted.   Noted no overlying erythema, induration, or other signs of local infection.   Skin prepped in a sterile fashion.   Local anesthesia: Topical Ethyl chloride.   With sterile technique and under real time ultrasound guidance: 40 mg of Kenalog and 2 mL of Marcaine  injected into knee joint. Fluid seen entering the joint capsule.   Completed without difficulty   Pain immediately resolved suggesting accurate placement of the medication.   Advised to call if fevers/chills, erythema, induration, drainage, or persistent bleeding.   Images permanently stored and available for review in the ultrasound unit.  Impression: Technically successful ultrasound guided injection.   EXAM: MRI OF THE LEFT KNEE WITHOUT CONTRAST   TECHNIQUE: Multiplanar, multisequence MR imaging of the knee was performed. No intravenous contrast was administered.   COMPARISON:  X-ray 09/17/2020   FINDINGS: MENISCI   Medial meniscus: Intrasubstance degeneration with low-grade free edge and undersurface tearing of the posterior horn. Tiny free edge flap near the posterior root attachment site.   Lateral meniscus:  Intact.   LIGAMENTS   Cruciates:  Intact ACL and PCL.   Collaterals: Medial collateral ligament is intact. Lateral collateral ligament complex is intact.   CARTILAGE   Patellofemoral: Mild chondral thinning of the lateral patellar facet and patellar apex.   Medial: Mild chondral thinning of the weight-bearing medial compartment.   Lateral:  No chondral defect.   Joint: Physiologic amount of joint fluid without significant effusion. Fat pads within normal limits. Small intra-articular cystic structure at the posterior joint line measuring 17 x 8 mm, likely ganglion cyst. Additional small ganglion along the posterior margin of the PCL.   Popliteal Fossa:  Small Baker's cyst.  Intact popliteus tendon.   Extensor Mechanism:  Intact quadriceps tendon  and patellar tendon.   Bones: No acute fracture. No malalignment. Minimal reactive subchondral marrow signal changes at the periphery of the medial tibial plateau. Intraosseous cystic structure closely approximating the PCL attachment site within the posterior aspect of the tibia, likely reflecting an  intraosseous ganglion. No suspicious marrow replacing bone lesion.   Other: Mild pes anserine bursal edema. Faint intramuscular edema within the lateral head of the gastrocnemius and soleus muscles.   IMPRESSION: 1. Intrasubstance degeneration with low-grade free edge and undersurface tearing of the posterior horn of the medial meniscus. Tiny free edge flap near the posterior root attachment site. 2. Mild medial and patellofemoral compartment osteoarthritis. 3. Small Baker's cyst. 4. Faint intramuscular edema within the lateral head of the gastrocnemius and soleus muscles, which may reflect muscle strain.     Electronically Signed   By: Davina Poke D.O.   On: 01/27/2021 09:59   I, [name], personally (independently) visualized and performed the interpretation of the images attached in this note.       Assessment and Plan: 71 y.o. female with left knee pain due to a bit of DJD.  Doing reasonably well but having an upcoming ski trip in the next few days.  Plan for steroid injection today and continued home exercise program.  Additionally she has some continued lateral hip pain that she is working through with home exercise program she should continue home exercise program.  Recheck back as needed.   PDMP not reviewed this encounter. Orders Placed This Encounter  Procedures   [place] LIMITED JOINT SPACE STRUCTURES LOW LEFT(NO LINKED CHARGES)    Order Specific Question:   Reason for Exam (SYMPTOM  OR DIAGNOSIS REQUIRED)    Answer:   L knee pain    Order Specific Question:   Preferred imaging location?    Answer:   [place]   No orders of the defined types were placed in this encounter.    Discussed warning signs or symptoms. Please see discharge instructions. Patient expresses understanding.   The above documentation has been reviewed and is accurate and complete [name], M.D.

## 2021-03-18 NOTE — Patient Instructions (Signed)
Good to see you today.  You had a L knee steroid injection.  Call or go to the ER if you develop a large red swollen joint with extreme pain or oozing puss.   Con't working on your home exercises as shown to you by PT.  Follow-up: as needed

## 2021-03-19 ENCOUNTER — Telehealth: Payer: Self-pay

## 2021-03-19 NOTE — Telephone Encounter (Signed)
Patient called with questions regarding relief locations and missing names on her foot orthotics. Assured her locations of reliefs are deliberate and can be adjusted if necessary. Also informed patient that orthotic fabrication record is included in her chart in the event the manufacturer's stickers ever peeled off (as they appear to have). Patient is satisfied. All questions answered and concerns addressed.

## 2021-04-03 ENCOUNTER — Other Ambulatory Visit: Payer: Self-pay

## 2021-04-03 ENCOUNTER — Ambulatory Visit (INDEPENDENT_AMBULATORY_CARE_PROVIDER_SITE_OTHER): Payer: Medicare Other

## 2021-04-03 ENCOUNTER — Ambulatory Visit (INDEPENDENT_AMBULATORY_CARE_PROVIDER_SITE_OTHER): Payer: Medicare Other | Admitting: Internal Medicine

## 2021-04-03 ENCOUNTER — Encounter: Payer: Self-pay | Admitting: Internal Medicine

## 2021-04-03 VITALS — BP 132/78 | HR 79 | Temp 97.9°F | Resp 16 | Ht 64.0 in | Wt 121.0 lb

## 2021-04-03 DIAGNOSIS — J069 Acute upper respiratory infection, unspecified: Secondary | ICD-10-CM | POA: Diagnosis not present

## 2021-04-03 DIAGNOSIS — Z0000 Encounter for general adult medical examination without abnormal findings: Secondary | ICD-10-CM | POA: Diagnosis not present

## 2021-04-03 DIAGNOSIS — J029 Acute pharyngitis, unspecified: Secondary | ICD-10-CM | POA: Diagnosis not present

## 2021-04-03 LAB — POCT RAPID STREP A (OFFICE): Rapid Strep A Screen: NEGATIVE

## 2021-04-03 NOTE — Progress Notes (Signed)
Subjective:    Patient ID: Bonnie [name], female    DOB: May 27, 1950, 71 y.o.   MRN: [id number]  DOS:  04/03/2021 Type of visit - description: Acute  9 days ago, [place] the [place], and developed some sore throat, throat congestion and mucus accumulation there, some cough. The symptoms went on and off. Approximately 3 days ago developed L>R eye irritation, conjunctivas were injected, she saw some dried discharge at the corner of the eyes. Overall today eye symptoms are better, she is still coughing and having some sore throat. COVID test at home yesterday negative.  Review of Systems No fever chills Occasional sneezing Mild sinus pain. + Clear nasal discharge throughout the last few days Denies any nausea vomiting.  No myalgias.  Past Medical History:  Diagnosis Date   Genital herpes in women 02-17-09   she "plays with" the dose: 1/2-1 tab per day   Haglund's deformity 10/2015   Right; with posterior calcaneal heel spur (Podiatrist)   History of fracture of phalanx of thumb 09/2016   [facility]: left thumb, possible impacted fracture of distal portion of proximal phalanx of thumb.  Splint rx'd and ortho f/u advised.   Lactose intolerance    ?   Multiple allergies 08/02/2010   Osteopenia 2014   2014 DEXA: Lowest tscore - 1.7 at left femoral neck.  07/2017 T-score -2.0 femur neck   Prediabetes 2014   Saw nutritionist (HbA1c 6.1% 02/2014).  A1c 5.7% 08/2015.  A1c 6.0% 08/2017. A1c 6.1% 09/2018.   Tendonitis, Achilles, right 2018   Podiatrist   Vallecular cyst 04/09/15   ? cause of chronic cough?--Dr. Simeon Craft (ENT) removed this and her cough resolved.    Past Surgical History:  Procedure Laterality Date   BREAST CYST ASPIRATION     broken leg  6 months old   cataract surgery  4-12 and 5-12   COLONOSCOPY  2009; 01/04/18   2009 normal.  Rpt 01/04/18 normal (Dr. Collene Mares).  Recall 10 yrs.   COSMETIC SURGERY  71 yrs old   nose   DEXA  2014; 2019   2014 T-score -1.7.             07/2017 T-score -2.0 femur neck. 09/2019 -2.1.   INCISION AND DRAINAGE  08/11/2016   Epidermal inclusion cyst on back (done by MD at [facility] in [place]).   teeth pulled  71 yrs old   Vallecular cyst excision  2017   cough resolved after this was removed.    Current Outpatient Medications  Medication Instructions   acyclovir (ZOVIRAX) 400 mg, 2 times daily   Calcium Carbonate-Vitamin D (CALCIUM-VITAMIN D) 500-200 MG-UNIT per tablet 1 tablet, Oral, 2 times daily,     estradiol (ESTRACE) 0.1 MG/GM vaginal cream 1 Applicatorful, Vaginal, Weekly, Pt uses every other week.   hydrocortisone 2.5 % cream APPLY SPARINGLY TO AFFECTED AREA 2 TO 4 TIMES A DAY   multivitamin (THERAGRAN) tablet 1 tablet, Daily       Objective:   Physical Exam BP 132/78 (BP Location: Left Arm, Patient Position: Sitting, Cuff Size: Small)    Pulse 79    Temp 97.9 F (36.6 C) (Oral)    Resp 16    Ht 5\' 4"  (1.626 m)    Wt 121 lb (54.9 kg)    SpO2 98%    BMI 20.77 kg/m  General:   Well developed, NAD, BMI noted. HEENT:  Normocephalic . Face symmetric, atraumatic. TMs: Bulging bilaterally, normal rate. Throat:  Uvula midline, tonsils are small, mild redness. Nose: Congested EOMI, pupils equal and reactive, anterior chambers clear Lungs:  CTA B Normal respiratory effort, no intercostal retractions, no accessory muscle use. Heart: RRR,  no murmur.  Lower extremities: no pretibial edema bilaterally  Skin: Not pale. Not jaundice Neurologic:  alert & oriented X3.  Speech normal, gait appropriate for age and unassisted Psych--  Cognition and judgment appear intact.  Cooperative with normal attention span and concentration.  Behavior appropriate. No anxious or depressed appearing.      Assessment     71 year old female, PMH includes osteopenia, prediabetes, presents with:  URI: Symptoms started 9 days ago, still has a cough but overall feels better.  Has some eye sxs but they are improving.   Physical exam is benign Strep test: Negative. Plan: Conservative treatment w/ Flonase, Astepro, avoid  decongestants.  See AVS.   This visit occurred during the SARS-CoV-2 public health emergency.  Safety protocols were in place, including screening questions prior to the visit, additional usage of staff PPE, and extensive cleaning of exam room while observing appropriate contact time as indicated for disinfecting solutions.

## 2021-04-03 NOTE — Patient Instructions (Signed)
[name] , Thank you for taking time to come for your Medicare Wellness Visit. I appreciate your ongoing commitment to your health goals. Please review the following plan we discussed and let me know if I can assist you in the future.   Screening recommendations/referrals: Colonoscopy: Done 01/04/18 repeat every 10 years  Mammogram: Done 10/11/20 repeat every year Bone Density: Done 10/13/19 repeat every 2 years  Recommended yearly ophthalmology/optometry visit for glaucoma screening and checkup Recommended yearly dental visit for hygiene and checkup  Vaccinations: Influenza vaccine: Done 12/13/20 repeat every year  Pneumococcal vaccine: Up to date Tdap vaccine: Due and discussed Shingles vaccine: Completed   11/21/17 & 03/21/18 Covid-19:Completed 3/2, 3/31, 12/19/19 & 5/3, 12/13/20  Advanced directives: Please bring a copy of your health care power of attorney and living will to the office at your convenience.  Conditions/risks identified: None at this time   Next appointment: Follow up in one year for your annual wellness visit    Preventive Care 65 Years and Older, Female Preventive care refers to lifestyle choices and visits with your health care provider that can promote health and wellness. What does preventive care include? A yearly physical exam. This is also called an annual well check. Dental exams once or twice a year. Routine eye exams. Ask your health care provider how often you should have your eyes checked. Personal lifestyle choices, including: Daily care of your teeth and gums. Regular physical activity. Eating a healthy diet. Avoiding tobacco and drug use. Limiting alcohol use. Practicing safe sex. Taking low-dose aspirin every day. Taking vitamin and mineral supplements as recommended by your health care provider. What happens during an annual well check? The services and screenings done by your health care provider during your annual well check will depend on  your age, overall health, lifestyle risk factors, and family history of disease. Counseling  Your health care provider may ask you questions about your: Alcohol use. Tobacco use. Drug use. Emotional well-being. Home and relationship well-being. Sexual activity. Eating habits. History of falls. Memory and ability to understand (cognition). Work and work Statistician. Reproductive health. Screening  You may have the following tests or measurements: Height, weight, and BMI. Blood pressure. Lipid and cholesterol levels. These may be checked every 5 years, or more frequently if you are over 50 years old. Skin check. Lung cancer screening. You may have this screening every year starting at age 96 if you have a 30-pack-year history of smoking and currently smoke or have quit within the past 15 years. Fecal occult blood test (FOBT) of the stool. You may have this test every year starting at age 63. Flexible sigmoidoscopy or colonoscopy. You may have a sigmoidoscopy every 5 years or a colonoscopy every 10 years starting at age 57. Hepatitis C blood test. Hepatitis B blood test. Sexually transmitted disease (STD) testing. Diabetes screening. This is done by checking your blood sugar (glucose) after you have not eaten for a while (fasting). You may have this done every 1-3 years. Bone density scan. This is done to screen for osteoporosis. You may have this done starting at age 52. Mammogram. This may be done every 1-2 years. Talk to your health care provider about how often you should have regular mammograms. Talk with your health care provider about your test results, treatment options, and if necessary, the need for more tests. Vaccines  Your health care provider may recommend certain vaccines, such as: Influenza vaccine. This is recommended every year. Tetanus, diphtheria, and acellular  pertussis (Tdap, Td) vaccine. You may need a Td booster every 10 years. Zoster vaccine. You may need this  after age 53. Pneumococcal 13-valent conjugate (PCV13) vaccine. One dose is recommended after age 63. Pneumococcal polysaccharide (PPSV23) vaccine. One dose is recommended after age 44. Talk to your health care provider about which screenings and vaccines you need and how often you need them. This information is not intended to replace advice given to you by your health care provider. Make sure you discuss any questions you have with your health care provider. Document Released: 03/02/2015 Document Revised: 10/24/2015 Document Reviewed: 12/05/2014 Elsevier Interactive Patient Education  2017 [place] Prevention in the Home Falls can cause injuries. They can happen to people of all ages. There are many things you can do to make your home safe and to help prevent falls. What can I do on the outside of my home? Regularly fix the edges of walkways and driveways and fix any cracks. Remove anything that might make you trip as you walk through a door, such as a raised step or threshold. Trim any bushes or trees on the path to your home. Use bright outdoor lighting. Clear any walking paths of anything that might make someone trip, such as rocks or tools. Regularly check to see if handrails are loose or broken. Make sure that both sides of any steps have handrails. Any raised decks and porches should have guardrails on the edges. Have any leaves, snow, or ice cleared regularly. Use sand or salt on walking paths during winter. Clean up any spills in your garage right away. This includes oil or grease spills. What can I do in the bathroom? Use night lights. Install grab bars by the toilet and in the tub and shower. Do not use towel bars as grab bars. Use non-skid mats or decals in the tub or shower. If you need to sit down in the shower, use a plastic, non-slip stool. Keep the floor dry. Clean up any water that spills on the floor as soon as it happens. Remove soap buildup in the tub or  shower regularly. Attach bath mats securely with double-sided non-slip rug tape. Do not have throw rugs and other things on the floor that can make you trip. What can I do in the bedroom? Use night lights. Make sure that you have a light by your bed that is easy to reach. Do not use any sheets or blankets that are too big for your bed. They should not hang down onto the floor. Have a firm chair that has side arms. You can use this for support while you get dressed. Do not have throw rugs and other things on the floor that can make you trip. What can I do in the kitchen? Clean up any spills right away. Avoid walking on wet floors. Keep items that you use a lot in easy-to-reach places. If you need to reach something above you, use a strong step stool that has a grab bar. Keep electrical cords out of the way. Do not use floor polish or wax that makes floors slippery. If you must use wax, use non-skid floor wax. Do not have throw rugs and other things on the floor that can make you trip. What can I do with my stairs? Do not leave any items on the stairs. Make sure that there are handrails on both sides of the stairs and use them. Fix handrails that are broken or loose. Make sure that handrails  are as long as the stairways. Check any carpeting to make sure that it is firmly attached to the stairs. Fix any carpet that is loose or worn. Avoid having throw rugs at the top or bottom of the stairs. If you do have throw rugs, attach them to the floor with carpet tape. Make sure that you have a light switch at the top of the stairs and the bottom of the stairs. If you do not have them, ask someone to add them for you. What else can I do to help prevent falls? Wear shoes that: Do not have high heels. Have rubber bottoms. Are comfortable and fit you well. Are closed at the toe. Do not wear sandals. If you use a stepladder: Make sure that it is fully opened. Do not climb a closed stepladder. Make  sure that both sides of the stepladder are locked into place. Ask someone to hold it for you, if possible. Clearly mark and make sure that you can see: Any grab bars or handrails. First and last steps. Where the edge of each step is. Use tools that help you move around (mobility aids) if they are needed. These include: Canes. Walkers. Scooters. Crutches. Turn on the lights when you go into a dark area. Replace any light bulbs as soon as they burn out. Set up your furniture so you have a clear path. Avoid moving your furniture around. If any of your floors are uneven, fix them. If there are any pets around you, be aware of where they are. Review your medicines with your doctor. Some medicines can make you feel dizzy. This can increase your chance of falling. Ask your doctor what other things that you can do to help prevent falls. This information is not intended to replace advice given to you by your health care provider. Make sure you discuss any questions you have with your health care provider. Document Released: 11/30/2008 Document Revised: 07/12/2015 Document Reviewed: 03/10/2014 Elsevier Interactive Patient Education  2017 [employer].

## 2021-04-03 NOTE — Progress Notes (Signed)
Virtual Visit via Telephone Note  I connected with  Bonnie [name] on 04/03/21 at  8:00 AM EST by telephone and verified that I am speaking with the correct person using two identifiers.  Medicare Annual Wellness visit completed telephonically due to Covid-19 pandemic.   Persons participating in this call: This Health Coach and this patient.   Location: Patient: Home Provider: Office   I discussed the limitations, risks, security and privacy concerns of performing an evaluation and management service by telephone and the availability of in person appointments. The patient expressed understanding and agreed to proceed.  Unable to perform video visit due to video visit attempted and failed and/or patient does not have video capability.   Some vital signs may be absent or patient reported.   Willette Brace, LPN   Subjective:   Bonnie [name] is a 71 y.o. female who presents for an Initial Medicare Annual Wellness Visit.  Review of Systems     Cardiac Risk Factors include: advanced age (>87men, >46 women)     Objective:    There were no vitals filed for this visit. There is no height or weight on file to calculate BMI.  Advanced Directives 04/03/2021  Does Patient Have a Medical Advance Directive? Yes  Type of Advance Directive [place] in Chart? No - copy requested    Current Medications (verified) Outpatient Encounter Medications as of 04/03/2021  Medication Sig   acyclovir (ZOVIRAX) 400 MG tablet Take 400 mg by mouth 2 (two) times daily.   Calcium Carbonate-Vitamin D (CALCIUM-VITAMIN D) 500-200 MG-UNIT per tablet Take 1 tablet by mouth 2 (two) times daily.   estradiol (ESTRACE) 0.1 MG/GM vaginal cream Place 1 Applicatorful vaginally once a week. Pt uses every other week.   hydrocortisone 2.5 % cream APPLY SPARINGLY TO AFFECTED AREA 2 TO 4 TIMES A DAY   multivitamin (THERAGRAN) tablet Take 1 tablet by mouth daily.  One a day   FLUZONE HIGH-DOSE QUADRIVALENT 0.7 ML SUSY    PFIZER COVID-19 VAC BIVALENT injection    No facility-administered encounter medications on file as of 04/03/2021.    Allergies (verified) Patient has no known allergies.   History: Past Medical History:  Diagnosis Date   Genital herpes in women 02-17-09   she "plays with" the dose: 1/2-1 tab per day   Haglund's deformity 10/2015   Right; with posterior calcaneal heel spur (Podiatrist)   History of fracture of phalanx of thumb 09/2016   [facility]: left thumb, possible impacted fracture of distal portion of proximal phalanx of thumb.  Splint rx'd and ortho f/u advised.   Lactose intolerance    ?   Multiple allergies 08/02/2010   Osteopenia 2014   2014 DEXA: Lowest tscore - 1.7 at left femoral neck.  07/2017 T-score -2.0 femur neck   Prediabetes 2014   Saw nutritionist (HbA1c 6.1% 02/2014).  A1c 5.7% 08/2015.  A1c 6.0% 08/2017. A1c 6.1% 09/2018.   Tendonitis, Achilles, right 2018   Podiatrist   Vallecular cyst 04/09/15   ? cause of chronic cough?--Dr. Simeon Craft (ENT) removed this and her cough resolved.   Past Surgical History:  Procedure Laterality Date   BREAST CYST ASPIRATION     broken leg  6 months old   cataract surgery  4-12 and 5-12   COLONOSCOPY  2009; 01/04/18   2009 normal.  Rpt 01/04/18 normal (Dr. Collene Mares).  Recall 10 yrs.   COSMETIC SURGERY  71 yrs old  nose   DEXA  2014; 2019   2014 T-score -1.7.            07/2017 T-score -2.0 femur neck. 09/2019 -2.1.   INCISION AND DRAINAGE  08/11/2016   Epidermal inclusion cyst on back (done by MD at [facility] in [place]).   teeth pulled  71 yrs old   Vallecular cyst excision  2017   cough resolved after this was removed.   Family History  Problem Relation Age of Onset   Heart disease Father        passed after by pass surgery   Hypertension Father    Hyperlipidemia Father    Other Sister        auto immune issues   Gout Sister    Autoimmune disease  Sister        fatigue, rashes   Osteopenia Sister    Breast cancer Sister    Osteoporosis Mother    Social History   Socioeconomic History   Marital status: Married    Spouse name: Not on file   Number of children: Not on file   Years of education: Not on file   Highest education level: Not on file  Occupational History   Not on file  Tobacco Use   Smoking status: Never   Smokeless tobacco: Never  Vaping Use   Vaping Use: Never used  Substance and Sexual Activity   Alcohol use: Yes    Comment: 1-2 beers a week or 1-2 glasses of wine a week   Drug use: No   Sexual activity: Yes    Partners: Male  Other Topics Concern   Not on file  Social History Narrative   Married, 2 grown children.   Lives in [place], [place] from [place].   Occupation: Optician, dispensing   No tob, 1-2 beer/wine per week.  No hx of drug abuse or alcohol probs.   Enjoys outdoors, snow skiing, swimming, running, hiking.   Social Determinants of Health   Financial Resource Strain: Low Risk    Difficulty of Paying Living Expenses: Not hard at all  Food Insecurity: No Food Insecurity   Worried About Charity fundraiser in the Last Year: Never true   [place] in the Last Year: Never true  Transportation Needs: No Transportation Needs   Lack of Transportation (Medical): No   Lack of Transportation (Non-Medical): No  Physical Activity: Sufficiently Active   Days of Exercise per Week: 7 days   Minutes of Exercise per Session: 60 min  Stress: No Stress Concern Present   Feeling of Stress : Not at all  Social Connections: Moderately Integrated   Frequency of Communication with Friends and Family: More than three times a week   Frequency of Social Gatherings with Friends and Family: More than three times a week   Attends Religious Services: Never   Marine scientist or Organizations: Yes   Attends Music therapist: 1 to 4 times per year   Marital Status: Married    Tobacco  Counseling Counseling given: Not Answered   Clinical Intake:  Pre-visit preparation completed: Yes  Pain : No/denies pain     BMI - recorded: 21.39 Nutritional Status: BMI of 19-24  Normal Nutritional Risks: None Diabetes: No  How often do you need to have someone help you when you read instructions, pamphlets, or other written materials from your doctor or pharmacy?: 1 - Never  Diabetic?No  Interpreter Needed?: No  Information entered by :: Charlott Rakes, LPN   Activities of Daily Living In your present state of health, do you have any difficulty performing the following activities: 04/03/2021  Hearing? N  Vision? N  Difficulty concentrating or making decisions? N  Walking or climbing stairs? N  Dressing or bathing? N  Doing errands, shopping? N  Preparing Food and eating ? N  Using the Toilet? N  In the past six months, have you accidently leaked urine? Y  Comment accidents at times when toileting  Do you have problems with loss of bowel control? N  Managing your Medications? N  Managing your Finances? N  Housekeeping or managing your Housekeeping? N  Some recent data might be hidden    Patient Care Team: Tammi Sou, MD as PCP - General (Family Medicine) Olga Millers, MD as Consulting Physician (Obstetrics and Gynecology) Juanita Craver, MD as Consulting Physician (Gastroenterology) Sheard, Briscoe Burns, DPM (Inactive) as Consulting Physician (Podiatry) Regal, Tamala Fothergill, DPM as Consulting Physician (Podiatry) Gregor Hams, MD as Consulting Physician (Sports Medicine)  Indicate any recent Medical Services you may have received from other than [facility] providers in the past year (date may be approximate).     Assessment:   This is a routine wellness examination for Kavina.  Hearing/Vision screen Hearing Screening - Comments:: Pt denies any hearing issues  Vision Screening - Comments:: [facility] for [facility]   Dietary  issues and exercise activities discussed: Current Exercise Habits: Home exercise routine, Type of exercise: stretching;walking;Other - see comments, Time (Minutes): 60, Frequency (Times/Week): 7, Weekly Exercise (Minutes/Week): 420   Goals Addressed             This Visit's Progress    Patient Stated       None at this time        Depression Screen PHQ 2/9 Scores 04/03/2021 09/05/2020 09/16/2019 09/01/2018 09/11/2017 09/11/2016  PHQ - 2 Score 0 0 0 0 0 0    Fall Risk Fall Risk  04/03/2021 10/08/2020 09/16/2019 09/01/2018 09/11/2017  Falls in the past year? 0 0 0 0 No  Number falls in past yr: 0 0 0 0 -  Injury with Fall? 0 0 0 0 -  Risk for fall due to : Impaired vision - - - -  Follow up Falls prevention discussed Falls evaluation completed Falls evaluation completed Falls evaluation completed -    FALL RISK PREVENTION PERTAINING TO THE HOME:  Any stairs in or around the home? Yes  If so, are there any without handrails? No  Home free of loose throw rugs in walkways, pet beds, electrical cords, etc? Yes  Adequate lighting in your home to reduce risk of falls? Yes   ASSISTIVE DEVICES UTILIZED TO PREVENT FALLS:  Life alert? No  Use of a cane, walker or w/c? No  Grab bars in the bathroom? No  Shower chair or bench in shower? No  Elevated toilet seat or a handicapped toilet? No   TIMED UP AND GO:  Was the test performed? No .   Cognitive Function:     6CIT Screen 04/03/2021  What Year? 0 points  What month? 0 points  What time? 0 points  Count back from 20 0 points  Months in reverse 0 points  Repeat phrase 2 points  Total Score 2    Immunizations Immunization History  Administered Date(s) Administered   Hepatitis A 09/26/2005, 10/31/2005, 02/04/2010   Hepatitis B 09/26/2005, 10/31/2005,  02/04/2010   IPV 09/26/2005   Influenza Whole 11/28/2010   Influenza, High Dose Seasonal PF 11/21/2017   Influenza-Unspecified 12/16/2013, 12/17/2015, 11/21/2017, 10/31/2018,  12/13/2020   Meningococcal Conjugate 09/26/2005   Moderna Sars-Covid-2 Vaccination 04/19/2019, 05/18/2019, 12/19/2019, 06/19/2020, 12/13/2020   Pneumococcal Conjugate-13 09/11/2017   Pneumococcal Polysaccharide-23 09/16/2019   Tdap 10/31/2005, 02/17/2010   Typhoid Live 09/26/2005   Yellow Fever 09/26/2005   Zoster Recombinat (Shingrix) 11/21/2017, 03/21/2018   Zoster, Live 09/16/2012    TDAP status: Due, Education has been provided regarding the importance of this vaccine. Advised may receive this vaccine at local pharmacy or Health Dept. Aware to provide a copy of the vaccination record if obtained from local pharmacy or Health Dept. Verbalized acceptance and understanding.  Flu Vaccine status: Up to date  Pneumococcal vaccine status: Up to date  Covid-19 vaccine status: Completed vaccines  Qualifies for Shingles Vaccine? Yes   Zostavax completed Yes   Shingrix Completed?: Yes  Screening Tests Health Maintenance  Topic Date Due   TETANUS/TDAP  02/18/2020   MAMMOGRAM  10/11/2021   COLONOSCOPY (Pts 45-69yrs Insurance coverage will need to be confirmed)  01/05/2028   Pneumonia Vaccine 11+ Years old  Completed   INFLUENZA VACCINE  Completed   DEXA SCAN  Completed   COVID-19 Vaccine  Completed   Hepatitis C Screening  Completed   Zoster Vaccines- Shingrix  Completed   HPV VACCINES  Aged Out    Health Maintenance  Health Maintenance Due  Topic Date Due   TETANUS/TDAP  02/18/2020    Colorectal cancer screening: Type of screening: Colonoscopy. Completed 01/04/18. Repeat every 10 years  Mammogram status: Completed 10/11/20. Repeat every year  Bone Density status: Completed 10/13/19. Results reflect: Bone density results: OSTEOPENIA. Repeat every 2 years.   Additional Screening:  Hepatitis C Screening:  Completed 03/19/16  Vision Screening: Recommended annual ophthalmology exams for early detection of glaucoma and other disorders of the eye. Is the patient up to date  with their annual eye exam?  Yes  Who is the provider or what is the name of the office in which the patient attends annual eye exams? [facility]  If pt is not established with a provider, would they like to be referred to a provider to establish care? No .   Dental Screening: Recommended annual dental exams for proper oral hygiene  Community Resource Referral / Chronic Care Management: CRR required this visit?  No   CCM required this visit?  No      Plan:     I have personally reviewed and noted the following in the patients chart:   Medical and social history Use of alcohol, tobacco or illicit drugs  Current medications and supplements including opioid prescriptions. Patient is not currently taking opioid prescriptions. Functional ability and status Nutritional status Physical activity Advanced directives List of other physicians Hospitalizations, surgeries, and ER visits in previous 12 months Vitals Screenings to include cognitive, depression, and falls Referrals and appointments  In addition, I have reviewed and discussed with patient certain preventive protocols, quality metrics, and best practice recommendations. A written personalized care plan for preventive services as well as general preventive health recommendations were provided to patient.     Willette Brace, LPN   [id number]   Nurse Notes: none

## 2021-04-03 NOTE — Patient Instructions (Addendum)
°  For cough:  Take Mucinex DM or Robitussin-DM OTC.  Follow the instructions in the box.   For nasal congestion: -Use over-the-counter Flonase: 2 nasal sprays on each side of the nose in the morning until you feel better  -Use OTC Astepro 2 nasal sprays on each side of the nose twice daily until better  Avoid decongestants such as  Pseudoephedrine or phenylephrine    Artificial eyedrops if needed   Call if not gradually better over the next  10 days   Call anytime if the symptoms are severe

## 2021-04-05 ENCOUNTER — Ambulatory Visit: Payer: Medicare Other | Admitting: Family Medicine

## 2021-04-09 ENCOUNTER — Telehealth: Payer: Self-pay

## 2021-04-09 NOTE — Telephone Encounter (Signed)
LVM that orthotics are in and ready for pick up.

## 2021-04-10 ENCOUNTER — Encounter: Payer: Self-pay | Admitting: Internal Medicine

## 2021-04-10 NOTE — Telephone Encounter (Signed)
PT LEFT VOICEMAIL AND STATED THAT SHE WOULD COME BY THE OFFICE TO PICK UP ORTHOTICS. ORTHOTICS HAVE BEEN PLACED UP FRONT IN THE BIN.

## 2021-04-13 DIAGNOSIS — J019 Acute sinusitis, unspecified: Secondary | ICD-10-CM | POA: Diagnosis not present

## 2021-04-13 DIAGNOSIS — H66001 Acute suppurative otitis media without spontaneous rupture of ear drum, right ear: Secondary | ICD-10-CM | POA: Diagnosis not present

## 2021-04-29 ENCOUNTER — Other Ambulatory Visit: Payer: Self-pay

## 2021-04-29 ENCOUNTER — Encounter: Payer: Self-pay | Admitting: Family Medicine

## 2021-04-29 ENCOUNTER — Ambulatory Visit (INDEPENDENT_AMBULATORY_CARE_PROVIDER_SITE_OTHER): Payer: Medicare Other | Admitting: Family Medicine

## 2021-04-29 VITALS — BP 114/78 | HR 70 | Temp 97.6°F | Ht 64.0 in | Wt 122.4 lb

## 2021-04-29 DIAGNOSIS — J029 Acute pharyngitis, unspecified: Secondary | ICD-10-CM

## 2021-04-29 DIAGNOSIS — J069 Acute upper respiratory infection, unspecified: Secondary | ICD-10-CM

## 2021-04-29 LAB — POCT RAPID STREP A (OFFICE): Rapid Strep A Screen: NEGATIVE

## 2021-04-29 LAB — POC COVID19 BINAXNOW: SARS Coronavirus 2 Ag: NEGATIVE

## 2021-04-29 LAB — POCT INFLUENZA A/B
Influenza A, POC: NEGATIVE
Influenza B, POC: NEGATIVE

## 2021-04-29 NOTE — Progress Notes (Signed)
OFFICE VISIT ? ?04/29/2021 ? ?CC:  ?Chief Complaint  ?Patient presents with  ? Sore Throat  ?  Glands are swollen, hurts to swallow. Just started last night/this morning. Pt also c/o congestion.  ? ? ?Patient is a 71 y.o. female who presents for sore throat. ? ?HPI: ?Onset of sore throat the middle of the night last night.  Minimal nasal congestion.  No headache, ear pain, fever, or cough.  Says her glands in the front of her neck feel swollen. ?No shortness of breath, no wheezing. ?She had a URI with ear infections and was prescribed antibiotics by urgent care about a month ago.  All symptoms resolved and she felt well until last night. ? ?Past Medical History:  ?Diagnosis Date  ? Genital herpes in women 02-17-09  ? she "plays with" the dose: 1/2-1 tab per day  ? Haglund's deformity 10/2015  ? Right; with posterior calcaneal heel spur (Podiatrist)  ? History of fracture of phalanx of thumb 09/2016  ? [facility]: left thumb, possible impacted fracture of distal portion of proximal phalanx of thumb.  Splint rx'd and ortho f/u advised.  ? Lactose intolerance   ? ?  ? Multiple allergies 08/02/2010  ? Osteopenia 2014  ? 2014 DEXA: Lowest tscore - 1.7 at left femoral neck.  07/2017 T-score -2.0 femur neck  ? Prediabetes 2014  ? Saw nutritionist (HbA1c 6.1% 02/2014).  A1c 5.7% 08/2015.  A1c 6.0% 08/2017. A1c 6.1% 09/2018.  ? Tendonitis, Achilles, right 2018  ? Podiatrist  ? Vallecular cyst 04/09/15  ? ? cause of chronic cough?--Dr. Simeon Craft (ENT) removed this and her cough resolved.  ? ? ?Past Surgical History:  ?Procedure Laterality Date  ? BREAST CYST ASPIRATION    ? broken leg  6 months old  ? cataract surgery  4-12 and 5-12  ? COLONOSCOPY  2009; 01/04/18  ? 2009 normal.  Rpt 01/04/18 normal (Dr. Collene Mares).  Recall 10 yrs.  ? COSMETIC SURGERY  71 yrs old  ? nose  ? DEXA  2014; 2019  ? 2014 T-score -1.7.            07/2017 T-score -2.0 femur neck. 09/2019 -2.1.  ? INCISION AND DRAINAGE  08/11/2016  ? Epidermal inclusion cyst on back  (done by MD at [facility] in [place]).  ? teeth pulled  71 yrs old  ? Vallecular cyst excision  2017  ? cough resolved after this was removed.  ? ? ?Outpatient Medications Prior to Visit  ?Medication Sig Dispense Refill  ? acyclovir (ZOVIRAX) 400 MG tablet Take 400 mg by mouth 2 (two) times daily.    ? Calcium Carbonate-Vitamin D (CALCIUM-VITAMIN D) 500-200 MG-UNIT per tablet Take 1 tablet by mouth 2 (two) times daily.    ? estradiol (ESTRACE) 0.1 MG/GM vaginal cream Place 1 Applicatorful vaginally once a week. Pt uses every other week.    ? hydrocortisone 2.5 % cream APPLY SPARINGLY TO AFFECTED AREA 2 TO 4 TIMES A DAY  2  ? multivitamin (THERAGRAN) tablet Take 1 tablet by mouth daily. One a day    ? ?No facility-administered medications prior to visit.  ? ? ?No Known Allergies ? ?ROS ?As per HPI ? ?PE: ?Vitals with BMI 04/29/2021 04/03/2021 03/18/2021  ?Height '5\' 4"'$  '5\' 4"'$  '5\' 4"'$   ?Weight 122 lbs 6 oz 121 lbs 123 lbs 10 oz  ?BMI 21 20.76 21.21  ?Systolic 353 614 431  ?Diastolic 78 78 74  ?Pulse 70 79 67  ? ? ? ?  Physical Exam ? ?VS: noted--normal. ?Gen: alert, NAD, NONTOXIC APPEARING. ?HEENT: eyes without injection, drainage, or swelling.  Ears: EACs clear, TMs with normal light reflex and landmarks.  Nose: scant clear mucous, non- injected and non-edematous mucosa.  No paranasal sinus TTP.  No facial swelling.  Throat and mouth without focal lesion.  No pharyngial swelling, erythema, or exudate.   ?Neck: supple, no LAD but mild symmetric fullness to palpation in superior anterior neck. ?LUNGS: CTA bilat, nonlabored resps.   ?CV: RRR, no m/r/g. ?EXT: no c/c/e ?SKIN: no rash ? ?LABS:  ?Last metabolic panel ?Lab Results  ?Component Value Date  ? GLUCOSE 78 08/10/2020  ? NA 138 08/10/2020  ? K 4.1 08/10/2020  ? CL 102 08/10/2020  ? CO2 30 08/10/2020  ? BUN 20 08/10/2020  ? CREATININE 0.82 08/10/2020  ? CALCIUM 9.7 08/10/2020  ? PHOS 3.6 09/20/2012  ? PROT 6.5 08/10/2020  ? ALBUMIN 4.2 08/10/2020  ? BILITOT 0.6  08/10/2020  ? ALKPHOS 73 08/10/2020  ? AST 17 08/10/2020  ? ALT 13 08/10/2020  ? ?Rapid strept today: neg ?Rapid covid today: neg ?Rapid flu today: neg ? ?IMPRESSION AND PLAN: ? ?Viral URI/pharyngitis.  POC swabs all neg today. ?Discussed symptomatic care with salt water gargle, Tylenol as needed. ? ?An After Visit Summary was printed and given to the patient. ? ?FOLLOW UP: Return if symptoms worsen or fail to improve. ? ?Signed:  Crissie Sickles, MD           04/29/2021 ? ?

## 2021-07-08 ENCOUNTER — Telehealth: Payer: Self-pay

## 2021-07-08 DIAGNOSIS — R7303 Prediabetes: Secondary | ICD-10-CM

## 2021-07-08 NOTE — Telephone Encounter (Signed)
LM for pt to return call regarding appt.  Pt ok to schedule lab visit prior

## 2021-07-08 NOTE — Telephone Encounter (Signed)
Patient scheduled CPE appt with Dr. Anitra Lauth for 09/06/21.  Patient is requesting labs to be done prior to appt.  I told patient if approved by provider and if/when orders are entered into chart - I will call to schedule lab appt.  She agreed and understood.  Patient can be reached at [phone].

## 2021-07-08 NOTE — Telephone Encounter (Signed)
Please review and advise.

## 2021-07-08 NOTE — Telephone Encounter (Signed)
Okay, lab orders are in.

## 2021-07-09 NOTE — Telephone Encounter (Signed)
Spoke with pt and scheduled lab visit for 7/18

## 2021-08-26 DIAGNOSIS — R42 Dizziness and giddiness: Secondary | ICD-10-CM | POA: Diagnosis not present

## 2021-08-26 LAB — COMPREHENSIVE METABOLIC PANEL
Albumin: 4.1 (ref 3.5–5.0)
Calcium: 9.2 (ref 8.7–10.7)
Globulin: 2.5
eGFR: 79

## 2021-08-26 LAB — CBC AND DIFFERENTIAL
HCT: 36 (ref 36–46)
Hemoglobin: 12.1 (ref 12.0–16.0)
Neutrophils Absolute: 80
Platelets: 138 10*3/uL — AB (ref 150–400)
WBC: 3.8

## 2021-08-26 LAB — BASIC METABOLIC PANEL
BUN: 18 (ref 4–21)
CO2: 24 — AB (ref 13–22)
Chloride: 100 (ref 99–108)
Creatinine: 0.8 (ref 0.5–1.1)
Glucose: 154
Potassium: 3.6 mEq/L (ref 3.5–5.1)
Sodium: 138 (ref 137–147)

## 2021-08-26 LAB — CBC: RBC: 4.23 (ref 3.87–5.11)

## 2021-08-26 LAB — HEPATIC FUNCTION PANEL
ALT: 17 U/L (ref 7–35)
AST: 21 (ref 13–35)
Alkaline Phosphatase: 72 (ref 25–125)
Bilirubin, Total: 0.3

## 2021-08-30 ENCOUNTER — Ambulatory Visit (INDEPENDENT_AMBULATORY_CARE_PROVIDER_SITE_OTHER): Payer: Medicare Other | Admitting: Family Medicine

## 2021-08-30 ENCOUNTER — Encounter: Payer: Self-pay | Admitting: Family Medicine

## 2021-08-30 VITALS — BP 102/68 | HR 77 | Temp 98.0°F | Ht 64.0 in | Wt 118.8 lb

## 2021-08-30 DIAGNOSIS — R5383 Other fatigue: Secondary | ICD-10-CM | POA: Diagnosis not present

## 2021-08-30 DIAGNOSIS — D696 Thrombocytopenia, unspecified: Secondary | ICD-10-CM

## 2021-08-30 DIAGNOSIS — R42 Dizziness and giddiness: Secondary | ICD-10-CM | POA: Diagnosis not present

## 2021-08-30 DIAGNOSIS — R55 Syncope and collapse: Secondary | ICD-10-CM | POA: Diagnosis not present

## 2021-08-30 DIAGNOSIS — T679XXD Effect of heat and light, unspecified, subsequent encounter: Secondary | ICD-10-CM | POA: Diagnosis not present

## 2021-08-30 NOTE — Progress Notes (Signed)
OFFICE VISIT  08/30/2021  CC:  Chief Complaint  Patient presents with   Discuss recent labs    Pt has a concern regarding recent labs completed at urgent visit on 7/10,  platelet count low(138). Last Bonnie [name] she was outside, starting to feeling faint. Had orthostatics and advised she was dehydrated. She still feels a little dehydrated and tension in her neck.    Patient is a 71 y.o. female who presents for review of some abnormal labs from recent UC visit.  HPI: 4 days ago patient was outside in the heat and felt presyncopal.  She was able to get to some shade and her family gave her some fluids and she felt better but still lightheaded.  She went to the urgent care where they did some labs and she was told that she was dehydrated.  She had no other symptoms at that time other than feeling pretty tired.  Since that time she is continue to feel slightly dehydrated but denies any distinct feeling of dizziness or presyncope.  No headaches, no fevers, no chest pain or palpitations, no shortness of breath.  No recent tick bites or rash. She also had a day of feeling bad 2 days prior to this recent episode of presyncope.  Felt severe exhaustion and threw up twice--she says the exhaustion had been creeping on for weeks since going to [place], having jet lag, and then having family visit her here after that.  Labs from [facility] reviewed, dated 08/26/2021:  Complete metabolic panel normal except non -fasting glucose 154 and BUN 24. She has history of prediabetes, most recent A1c was about a year ago and was 6.2%.  Complete blood count normal except platelets 138 K. No history of thrombocytopenia.  ROS as above, plus-->  no wheezing, no cough, no melena/hematochezia.  No polyuria or polydipsia.  No myalgias or arthralgias.  No focal weakness, paresthesias, or tremors.  No acute vision or hearing abnormalities.  No dysuria or unusual/new urinary urgency or frequency.  No recent changes in lower  legs. No diarrhea or abdominal pain.  Past Medical History:  Diagnosis Date   Genital herpes in women 02-17-09   she "plays with" the dose: 1/2-1 tab per day   Haglund's deformity 10/2015   Right; with posterior calcaneal heel spur (Podiatrist)   History of fracture of phalanx of thumb 09/2016   [facility]: left thumb, possible impacted fracture of distal portion of proximal phalanx of thumb.  Splint rx'd and ortho f/u advised.   Lactose intolerance    ?   Multiple allergies 08/02/2010   Osteopenia 2014   2014 DEXA: Lowest tscore - 1.7 at left femoral neck.  07/2017 T-score -2.0 femur neck   Prediabetes 2014   Saw nutritionist (HbA1c 6.1% 02/2014).  A1c 5.7% 08/2015.  A1c 6.0% 08/2017. A1c 6.1% 09/2018.   Tendonitis, Achilles, right 2018   Podiatrist   Vallecular cyst 04/09/15   ? cause of chronic cough?--Dr. Simeon Craft (ENT) removed this and her cough resolved.    Past Surgical History:  Procedure Laterality Date   BREAST CYST ASPIRATION     broken leg  6 months old   cataract surgery  4-12 and 5-12   COLONOSCOPY  2009; 01/04/18   2009 normal.  Rpt 01/04/18 normal (Dr. Collene Mares).  Recall 10 yrs.   COSMETIC SURGERY  71 yrs old   nose   DEXA  2014; 2019   2014 T-score -1.7.  07/2017 T-score -2.0 femur neck. 09/2019 -2.1.   INCISION AND DRAINAGE  08/11/2016   Epidermal inclusion cyst on back (done by MD at [facility] in [place]).   teeth pulled  71 yrs old   Vallecular cyst excision  2017   cough resolved after this was removed.    Outpatient Medications Prior to Visit  Medication Sig Dispense Refill   acyclovir (ZOVIRAX) 400 MG tablet Take 400 mg by mouth 2 (two) times daily.     Calcium Carbonate-Vitamin D (CALCIUM-VITAMIN D) 500-200 MG-UNIT per tablet Take 1 tablet by mouth 2 (two) times daily.     estradiol (ESTRACE) 0.1 MG/GM vaginal cream Place 1 Applicatorful vaginally once a week. Pt uses every other week.     multivitamin (THERAGRAN) tablet Take 1 tablet by  mouth daily. One a day     hydrocortisone 2.5 % cream APPLY SPARINGLY TO AFFECTED AREA 2 TO 4 TIMES A DAY (Patient not taking: Reported on 08/30/2021)  2   No facility-administered medications prior to visit.    No Known Allergies  ROS As per HPI  PE:    08/30/2021   11:02 AM 04/29/2021    3:07 PM 04/03/2021    3:52 PM  Vitals with BMI  Height 5' 4" 5' 4" 5' 4"  Weight 118 lbs 13 oz 122 lbs 6 oz 121 lbs  BMI 20.38 21 20.76  Systolic 102 114 132  Diastolic 68 78 78  Pulse 77 70 79     Physical Exam  Gen: Alert, well appearing.  Patient is oriented to person, place, time, and situation. AFFECT: pleasant, lucid thought and speech. ENT:Eyes: no injection, icteris, swelling, or exudate.  EOMI, PERRLA. Mouth: lips without lesion/swelling.  Oral mucosa pink and moist. Oropharynx without erythema, exudate, or swelling.  Neck - No masses or thyromegaly or limitation in range of motion CV: RRR, no m/r/g.   LUNGS: CTA bilat, nonlabored resps, good aeration in all lung fields. EXT: no clubbing or cyanosis.  no edema.    LABS:  Last CBC Lab Results  Component Value Date   WBC 3.8 08/26/2021   HGB 12.1 08/26/2021   HCT 36 08/26/2021   MCV 85.3 08/10/2020   RDW 13.8 08/10/2020   PLT 138 (A) 08/26/2021   Last metabolic panel Lab Results  Component Value Date   GLUCOSE 78 08/10/2020   NA 138 08/26/2021   K 3.6 08/26/2021   CL 100 08/26/2021   CO2 24 (A) 08/26/2021   BUN 18 08/26/2021   CREATININE 0.8 08/26/2021   EGFR 79 08/26/2021   CALCIUM 9.2 08/26/2021   PHOS 3.6 09/20/2012   PROT 6.5 08/10/2020   ALBUMIN 4.1 08/26/2021   BILITOT 0.6 08/10/2020   ALKPHOS 72 08/26/2021   AST 21 08/26/2021   ALT 17 08/26/2021   Last hemoglobin A1c Lab Results  Component Value Date   HGBA1C 6.2 08/10/2020   IMPRESSION AND PLAN:  #1 presyncope due to dehydration/heat exposure. She is feeling better but knows that she needs to improve her hydration habits. It is not clear if  her recent mild thrombocytopenia is at all related to this, but I would suspect it is more of a reactive process from recent acute physical stress with exhaustion and dehydration (+/- ? Viral syndrome).  Reassured her.  We will be repeating this next week when she comes for her fasting CPE labs.  An After Visit Summary was printed and given to the patient.  FOLLOW UP: Return   for keep appt for cpe next week.  Signed:  Phil McGowen, MD           08/30/2021  

## 2021-09-03 ENCOUNTER — Ambulatory Visit (INDEPENDENT_AMBULATORY_CARE_PROVIDER_SITE_OTHER): Payer: Medicare Other

## 2021-09-03 ENCOUNTER — Telehealth: Payer: Self-pay

## 2021-09-03 DIAGNOSIS — D696 Thrombocytopenia, unspecified: Secondary | ICD-10-CM

## 2021-09-03 DIAGNOSIS — R7303 Prediabetes: Secondary | ICD-10-CM

## 2021-09-03 LAB — LIPID PANEL
Cholesterol: 177 mg/dL (ref 0–200)
HDL: 52.3 mg/dL (ref >39.00)
LDL Cholesterol: 109 mg/dL — ABNORMAL HIGH (ref 0–99)
NonHDL: 124.28
Total CHOL/HDL Ratio: 3
Triglycerides: 78 mg/dL (ref 0.0–149.0)
VLDL: 15.6 mg/dL (ref 0.0–40.0)

## 2021-09-03 LAB — COMPREHENSIVE METABOLIC PANEL
ALT: 16 U/L (ref 0–35)
AST: 20 U/L (ref 0–37)
Albumin: 4.4 g/dL (ref 3.5–5.2)
Alkaline Phosphatase: 67 U/L (ref 39–117)
BUN: 15 mg/dL (ref 6–23)
CO2: 29 mEq/L (ref 19–32)
Calcium: 9.6 mg/dL (ref 8.4–10.5)
Chloride: 103 mEq/L (ref 96–112)
Creatinine, Ser: 0.76 mg/dL (ref 0.40–1.20)
GFR: 80.18 mL/min (ref >60.00)
Glucose, Bld: 94 mg/dL (ref 70–99)
Potassium: 4.5 mEq/L (ref 3.5–5.1)
Sodium: 139 mEq/L (ref 135–145)
Total Bilirubin: 0.5 mg/dL (ref 0.2–1.2)
Total Protein: 6.9 g/dL (ref 6.0–8.3)

## 2021-09-03 LAB — CBC WITH DIFFERENTIAL/PLATELET
Basophils Absolute: 0 10*3/uL (ref 0.0–0.1)
Basophils Relative: 0.8 % (ref 0.0–3.0)
Eosinophils Absolute: 0.1 10*3/uL (ref 0.0–0.7)
Eosinophils Relative: 4.2 % (ref 0.0–5.0)
HCT: 38.1 % (ref 36.0–46.0)
Hemoglobin: 12.6 g/dL (ref 12.0–15.0)
Lymphocytes Relative: 26.9 % (ref 12.0–46.0)
Lymphs Abs: 1 10*3/uL (ref 0.7–4.0)
MCHC: 33 g/dL (ref 30.0–36.0)
MCV: 85.2 fl (ref 78.0–100.0)
Monocytes Absolute: 0.5 10*3/uL (ref 0.1–1.0)
Monocytes Relative: 12.8 % — ABNORMAL HIGH (ref 3.0–12.0)
Neutro Abs: 2 10*3/uL (ref 1.4–7.7)
Neutrophils Relative %: 55.3 % (ref 43.0–77.0)
Platelets: 200 10*3/uL (ref 150.0–400.0)
RBC: 4.48 Mil/uL (ref 3.87–5.11)
RDW: 13.4 % (ref 11.5–15.5)
WBC: 3.6 10*3/uL — ABNORMAL LOW (ref 4.0–10.5)

## 2021-09-03 LAB — HEMOGLOBIN A1C: Hgb A1c MFr Bld: 6.6 % — ABNORMAL HIGH (ref 4.6–6.5)

## 2021-09-03 LAB — TSH: TSH: 1.4 u[IU]/mL (ref 0.35–5.50)

## 2021-09-03 NOTE — Telephone Encounter (Signed)
Pt seen pcp 7/14 Bonnie [name] Gender: Female DOB: Oct 01, 1950 Age: 71 Y 11 M 26 D Return Phone Number: [phone] (Primary), [phone] (Secondary) Address: City/ State/ Zip: [place] [address] Client [facility] - Client Client Site [facility] - Day Provider Crissie Sickles - MD Contact Type Call Who Is Calling Patient / Member / Family / Caregiver Call Type Triage / Clinical Caller Name Merry Proud Been Relationship To Patient Spouse Return Phone Number [phone] (Secondary) Chief Complaint Dizziness Reason for Call Symptomatic / Request for [place] states his wife was outside and got very dizzy 6 times before reaching the house, no other symptoms [place] Not Listed UC in area, per husband Translation No Nurse Assessment Nurse: Rolin Barry, RN, Levada Dy Date/Time (Eastern Time): 08/26/2021 1:10:36 PM Confirm and document reason for call. If symptomatic, describe symptoms. ---Caller states his wife was outside and got very dizzy 6 times before reaching the house, no other symptoms. Fells better in door and drinking something. Does the patient have any new or worsening symptoms? ---Yes Will a triage be completed? ---Yes Related visit to physician within the last 2 weeks? ---No Does the PT have any chronic conditions? (i.e. diabetes, asthma, this includes High risk factors for pregnancy, etc.) ---No Is this a behavioral health or substance abuse call? ---No Guidelines Guideline Title Affirmed Question Affirmed Notes Nurse Date/Time (Eastern Time) Dizziness - Lightheadedness Loss of vision or double vision (Exception: Similar to previous migraines.) Rolin Barry, RN, Levada Dy 08/26/2021 1:12:00 PM Disp. Time Eilene Ghazi Time) Disposition Final User 08/26/2021 1:19:33 PM Go to ED Now (or PCP triage) Yes Deaton, RN, Maryjo Rochester NOTE: All timestamps contained within this report are represented as  [place] Standard Time. CONFIDENTIALTY NOTICE: This fax transmission is intended only for the addressee. It contains information that is legally privileged, confidential or otherwise protected from use or disclosure. If you are not the intended recipient, you are strictly prohibited from reviewing, disclosing, copying using or disseminating any of this information or taking any action in reliance on or regarding this information. If you have received this fax in error, please notify us immediately by telephone so that we can arrange for its return to [place]. Phone: [phone], Toll-Free: [phone], Fax: [phone] Page: 2 of 2 Call Id: [id number] Final Disposition 08/26/2021 1:19:33 PM Go to ED Now (or PCP triage) Yes Deaton, RN, Cindee Lame Disagree/Comply Comply Caller Understands Yes PreDisposition Did not know what to do Care Advice Given Per Guideline GO TO ED NOW (OR PCP TRIAGE): ANOTHER ADULT SHOULD DRIVE: * It is better and safer if another adult drives instead of you. CARE ADVICE given per Dizziness (Adult) guideline. Comments User: Saverio Danker, RN Date/Time Eilene Ghazi Time): 08/26/2021 1:20:22 PM Verified with Hinton Dyer in the office that no appts available. Referrals GO TO FACILITY OTHER - SPECIFY

## 2021-09-06 ENCOUNTER — Encounter: Payer: Self-pay | Admitting: Family Medicine

## 2021-09-06 ENCOUNTER — Ambulatory Visit (INDEPENDENT_AMBULATORY_CARE_PROVIDER_SITE_OTHER): Payer: Medicare Other | Admitting: Family Medicine

## 2021-09-06 VITALS — BP 129/78 | HR 68 | Temp 97.9°F | Ht 66.25 in | Wt 120.0 lb

## 2021-09-06 DIAGNOSIS — R7303 Prediabetes: Secondary | ICD-10-CM

## 2021-09-06 DIAGNOSIS — E2839 Other primary ovarian failure: Secondary | ICD-10-CM

## 2021-09-06 DIAGNOSIS — Z23 Encounter for immunization: Secondary | ICD-10-CM

## 2021-09-06 DIAGNOSIS — Z0000 Encounter for general adult medical examination without abnormal findings: Secondary | ICD-10-CM | POA: Diagnosis not present

## 2021-09-06 DIAGNOSIS — M8580 Other specified disorders of bone density and structure, unspecified site: Secondary | ICD-10-CM

## 2021-09-06 MED ORDER — BLOOD GLUCOSE METER KIT: PACK | 0 refills | Status: DC

## 2021-09-06 MED ORDER — BLOOD GLUCOSE MONITOR KIT: PACK | 0 refills | Status: DC

## 2021-09-06 MED ORDER — TETANUS-DIPHTH-ACELL PERTUSSIS 5-2-15.5 LF-MCG/0.5 IM SUSP: 0.5000 mL | Freq: Once | INTRAMUSCULAR | 0 refills | Status: AC

## 2021-09-06 NOTE — Patient Instructions (Signed)

## 2021-09-06 NOTE — Addendum Note (Signed)
Addended by: Deveron Furlong D on: 09/06/2021 09:41 AM   Modules accepted: Orders

## 2021-09-06 NOTE — Progress Notes (Signed)
Office Note 09/06/2021  CC:  Chief Complaint  Patient presents with   Annual Exam    Pt is not fasting   Patient is a 71 y.o. female who is here for annual health maintenance exam and follow-up prediabetes. Reviewed labs done 3 days ago: All normal except hemoglobin A1c up to 6.6%.  She reports feeling well.  She is very active. When originally diagnosed with prediabetes about a decade ago she made very stringent changes in her diet regarding carbohydrates for a few years.  She has not been as dedicated to this in the last 5 or 6 years but does feel like she still does a pretty good job of limiting carbohydrates.  She has seen a nutritionist in the past.  Past Medical History:  Diagnosis Date   Genital herpes in women 02-17-09   she "plays with" the dose: 1/2-1 tab per day   Haglund's deformity 10/2015   Right; with posterior calcaneal heel spur (Podiatrist)   History of fracture of phalanx of thumb 09/2016   [facility]: left thumb, possible impacted fracture of distal portion of proximal phalanx of thumb.  Splint rx'd and ortho f/u advised.   Lactose intolerance    ?   Multiple allergies 08/02/2010   Osteopenia 2014   2014 DEXA: Lowest tscore - 1.7 at left femoral neck.  07/2017 T-score -2.0 femur neck   Prediabetes 2014   Saw nutritionist (HbA1c 6.1% 02/2014).  A1c 5.7% 08/2015.  A1c 6.0% 08/2017. A1c 6.1% 09/2018.   Tendonitis, Achilles, right 2018   Podiatrist   Vallecular cyst 04/09/15   ? cause of chronic cough?--Dr. Simeon Craft (ENT) removed this and her cough resolved.    Past Surgical History:  Procedure Laterality Date   BREAST CYST ASPIRATION     broken leg  6 months old   cataract surgery  4-12 and 5-12   COLONOSCOPY  2009; 01/04/18   2009 normal.  Rpt 01/04/18 normal (Dr. Collene Mares).  Recall 10 yrs.   COSMETIC SURGERY  71 yrs old   nose   DEXA  2014; 2019   2014 T-score -1.7.            07/2017 T-score -2.0 femur neck. 09/2019 -2.1.   INCISION AND DRAINAGE  08/11/2016    Epidermal inclusion cyst on back (done by MD at [facility] in [place]).   teeth pulled  71 yrs old   Vallecular cyst excision  2017   cough resolved after this was removed.    Family History  Problem Relation Age of Onset   Heart disease Father        passed after by pass surgery   Hypertension Father    Hyperlipidemia Father    Other Sister        auto immune issues   Gout Sister    Autoimmune disease Sister        fatigue, rashes   Osteopenia Sister    Breast cancer Sister    Osteoporosis Mother     Social History   Socioeconomic History   Marital status: Married    Spouse name: Not on file   Number of children: Not on file   Years of education: Not on file   Highest education level: Master's degree (e.g., MA, MS, MEng, MEd, MSW, MBA)  Occupational History   Not on file  Tobacco Use   Smoking status: Never   Smokeless tobacco: Never  Vaping Use   Vaping Use: Never used  Substance and Sexual  Activity   Alcohol use: Yes    Comment: 1-2 beers a week or 1-2 glasses of wine a week   Drug use: No   Sexual activity: Yes    Partners: Male  Other Topics Concern   Not on file  Social History Narrative   Married, 2 grown children.   Lives in [place], [place] from [place].   Occupation: Optician, dispensing   No tob, 1-2 beer/wine per week.  No hx of drug abuse or alcohol probs.   Enjoys outdoors, snow skiing, swimming, running, hiking.   Social Determinants of Health   Financial Resource Strain: Low Risk  (08/29/2021)   Overall Financial Resource Strain (CARDIA)    Difficulty of Paying Living Expenses: Not hard at all  Food Insecurity: No Food Insecurity (08/29/2021)   Hunger Vital Sign    Worried About Running Out of Food in the Last Year: Never true    Ran Out of Food in the Last Year: Never true  Transportation Needs: No Transportation Needs (08/29/2021)   PRAPARE - Hydrologist (Medical): No    Lack of Transportation  (Non-Medical): No  Physical Activity: Sufficiently Active (08/29/2021)   Exercise Vital Sign    Days of Exercise per Week: 4 days    Minutes of Exercise per Session: 50 min  Stress: No Stress Concern Present (08/29/2021)   [place]    Feeling of Stress : Not at all  Social Connections: Moderately Integrated (08/29/2021)   Social Connection and Isolation Panel [NHANES]    Frequency of Communication with Friends and Family: More than three times a week    Frequency of Social Gatherings with Friends and Family: Twice a week    Attends Religious Services: Never    Marine scientist or Organizations: Yes    Attends Music therapist: More than 4 times per year    Marital Status: Married  Human resources officer Violence: Not At Risk (04/03/2021)   Humiliation, Afraid, Rape, and Kick questionnaire    Fear of Current or Ex-Partner: No    Emotionally Abused: No    Physically Abused: No    Sexually Abused: No    Outpatient Medications Prior to Visit  Medication Sig Dispense Refill   acyclovir (ZOVIRAX) 400 MG tablet Take 400 mg by mouth 2 (two) times daily.     Calcium Carbonate-Vitamin D (CALCIUM-VITAMIN D) 500-200 MG-UNIT per tablet Take 1 tablet by mouth 2 (two) times daily.     estradiol (ESTRACE) 0.1 MG/GM vaginal cream Place 1 Applicatorful vaginally once a week. Pt uses every other week.     multivitamin (THERAGRAN) tablet Take 1 tablet by mouth daily. One a day     hydrocortisone 2.5 % cream APPLY SPARINGLY TO AFFECTED AREA 2 TO 4 TIMES A DAY (Patient not taking: Reported on 08/30/2021)  2   No facility-administered medications prior to visit.    No Known Allergies  ROS Review of Systems  Constitutional:  Negative for appetite change, chills, fatigue and fever.  HENT:  Negative for congestion, dental problem, ear pain and sore throat.   Eyes:  Negative for discharge, redness and visual disturbance.   Respiratory:  Negative for cough, chest tightness, shortness of breath and wheezing.   Cardiovascular:  Negative for chest pain, palpitations and leg swelling.  Gastrointestinal:  Negative for abdominal pain, blood in stool, diarrhea, nausea and vomiting.  Genitourinary:  Negative for difficulty urinating, dysuria, flank  pain, frequency, hematuria and urgency.  Musculoskeletal:  Negative for arthralgias, back pain, joint swelling, myalgias and neck stiffness.  Skin:  Negative for pallor and rash.  Neurological:  Negative for dizziness, speech difficulty, weakness and headaches.  Hematological:  Negative for adenopathy. Does not bruise/bleed easily.  Psychiatric/Behavioral:  Negative for confusion and sleep disturbance. The patient is not nervous/anxious.     PE;    09/06/2021    8:48 AM 08/30/2021   11:02 AM 04/29/2021    3:07 PM  Vitals with BMI  Height 5' 6.25" '5\' 4"'$  '5\' 4"'$   Weight 120 lbs 118 lbs 13 oz 122 lbs 6 oz  BMI 54.00 86.76 21  Systolic 195 093 267  Diastolic 78 68 78  Pulse 68 77 70   Exam chaperoned by Deveron Furlong, CMA.  Gen: Alert, well appearing.  Patient is oriented to person, place, time, and situation. AFFECT: pleasant, lucid thought and speech. ENT: Ears: EACs clear, normal epithelium.  TMs with good light reflex and landmarks bilaterally.  Eyes: no injection, icteris, swelling, or exudate.  EOMI, PERRLA. Nose: no drainage or turbinate edema/swelling.  No injection or focal lesion.  Mouth: lips without lesion/swelling.  Oral mucosa pink and moist.  Dentition intact and without obvious caries or gingival swelling.  Oropharynx without erythema, exudate, or swelling.  Neck: supple/nontender.  No LAD, mass, or TM.  Carotid pulses 2+ bilaterally, without bruits. CV: RRR, no m/r/g.   LUNGS: CTA bilat, nonlabored resps, good aeration in all lung fields. ABD: soft, NT, ND, BS normal.  No hepatospenomegaly or mass.  No bruits. EXT: no clubbing, cyanosis, or edema.   Musculoskeletal: no joint swelling, erythema, warmth, or tenderness.  ROM of all joints intact. Skin - no sores or suspicious lesions or rashes or color changes  Pertinent labs:  Lab Results  Component Value Date   TSH 1.40 09/03/2021   Lab Results  Component Value Date   WBC 3.6 (L) 09/03/2021   HGB 12.6 09/03/2021   HCT 38.1 09/03/2021   MCV 85.2 09/03/2021   PLT 200.0 09/03/2021   Lab Results  Component Value Date   CREATININE 0.76 09/03/2021   BUN 15 09/03/2021   NA 139 09/03/2021   K 4.5 09/03/2021   CL 103 09/03/2021   CO2 29 09/03/2021   Lab Results  Component Value Date   ALT 16 09/03/2021   AST 20 09/03/2021   ALKPHOS 67 09/03/2021   BILITOT 0.5 09/03/2021   Lab Results  Component Value Date   CHOL 177 09/03/2021   Lab Results  Component Value Date   HDL 52.30 09/03/2021   Lab Results  Component Value Date   LDLCALC 109 (H) 09/03/2021   Lab Results  Component Value Date   TRIG 78.0 09/03/2021   Lab Results  Component Value Date   CHOLHDL 3 09/03/2021   Last vitamin D Lab Results  Component Value Date   VD25OH 44 09/20/2012   ASSESSMENT AND PLAN:   1)  borderline diabetes. A1c 6.6%. No meds at this time. She will refocus on stringent low carbohydrate diet. I did prescribe a glucometer today for her to use occasionally. Plan is to return to see me in 6 months and we will do point-of-care hemoglobin A1c and point-of-care glucose at that time.  2) Health maintenance exam: Reviewed age and gender appropriate health maintenance issues (prudent diet, regular exercise, health risks of tobacco and excessive alcohol, use of seatbelts, fire alarms in home, use of sunscreen).  Also  reviewed age and gender appropriate health screening as well as vaccine recommendations. Vaccines: Tdap booster due->rx sent to pharmacy today.  Otherwise ALL UTD. Labs: none today, reviewed recent fasting HP results with pt today. Cervical ca screening: per GYN,  has  appt next month. Breast ca screening: per GYN, has appt next month. Colon ca screening: recall 2029. Osteoporosis screening: next DEXA 2023.  An After Visit Summary was printed and given to the patient.  FOLLOW UP:  Return in about 6 months (around 03/09/2022) for f/u borderline DM.  Signed:  Crissie Sickles, MD           09/06/2021

## 2021-09-24 ENCOUNTER — Ambulatory Visit (HOSPITAL_BASED_OUTPATIENT_CLINIC_OR_DEPARTMENT_OTHER)
Admission: RE | Admit: 2021-09-24 | Discharge: 2021-09-24 | Disposition: A | Discharge status: Home / Self Care | Payer: Medicare Other | Source: Ambulatory Visit | Attending: Family Medicine | Admitting: Family Medicine

## 2021-09-24 DIAGNOSIS — M8580 Other specified disorders of bone density and structure, unspecified site: Secondary | ICD-10-CM

## 2021-09-24 DIAGNOSIS — E2839 Other primary ovarian failure: Secondary | ICD-10-CM

## 2021-09-30 DIAGNOSIS — Z1231 Encounter for screening mammogram for malignant neoplasm of breast: Secondary | ICD-10-CM | POA: Diagnosis not present

## 2021-09-30 DIAGNOSIS — Z7689 Persons encountering health services in other specified circumstances: Secondary | ICD-10-CM | POA: Diagnosis not present

## 2021-10-01 ENCOUNTER — Other Ambulatory Visit: Payer: Self-pay | Admitting: Family Medicine

## 2021-10-01 LAB — HM MAMMOGRAPHY

## 2021-10-14 ENCOUNTER — Ambulatory Visit (HOSPITAL_BASED_OUTPATIENT_CLINIC_OR_DEPARTMENT_OTHER)
Admission: RE | Admit: 2021-10-14 | Discharge: 2021-10-14 | Disposition: A | Discharge status: Home / Self Care | Payer: Medicare Other | Source: Ambulatory Visit | Attending: Family Medicine | Admitting: Family Medicine

## 2021-10-14 ENCOUNTER — Encounter: Payer: Self-pay | Admitting: Family Medicine

## 2021-10-14 DIAGNOSIS — M8589 Other specified disorders of bone density and structure, multiple sites: Secondary | ICD-10-CM | POA: Diagnosis not present

## 2021-10-14 DIAGNOSIS — M8580 Other specified disorders of bone density and structure, unspecified site: Secondary | ICD-10-CM | POA: Insufficient documentation

## 2021-10-14 DIAGNOSIS — E2839 Other primary ovarian failure: Secondary | ICD-10-CM | POA: Diagnosis present

## 2021-10-14 DIAGNOSIS — Z780 Asymptomatic menopausal state: Secondary | ICD-10-CM | POA: Diagnosis not present

## 2021-12-12 ENCOUNTER — Telehealth: Payer: Self-pay

## 2021-12-12 NOTE — Telephone Encounter (Signed)
Patient calling in regards to checking her blood sugar at home. Should she check before meals, after meals; certain times of day; and how many times daily should she be checking?  Please advise [phone]

## 2021-12-12 NOTE — Telephone Encounter (Signed)
Pt advised of recommendations.  

## 2021-12-12 NOTE — Telephone Encounter (Signed)
Check glucose before breakfast on just a few days a week. Check a glucose 2 hours after supper a few days a week.

## 2021-12-12 NOTE — Telephone Encounter (Signed)
Please confirm how often pt should check sugars.

## 2022-01-02 DIAGNOSIS — L814 Other melanin hyperpigmentation: Secondary | ICD-10-CM | POA: Diagnosis not present

## 2022-01-02 DIAGNOSIS — Z872 Personal history of diseases of the skin and subcutaneous tissue: Secondary | ICD-10-CM | POA: Diagnosis not present

## 2022-01-02 DIAGNOSIS — L853 Xerosis cutis: Secondary | ICD-10-CM | POA: Diagnosis not present

## 2022-01-02 DIAGNOSIS — L821 Other seborrheic keratosis: Secondary | ICD-10-CM | POA: Diagnosis not present

## 2022-01-02 DIAGNOSIS — D225 Melanocytic nevi of trunk: Secondary | ICD-10-CM | POA: Diagnosis not present

## 2022-01-02 DIAGNOSIS — L905 Scar conditions and fibrosis of skin: Secondary | ICD-10-CM | POA: Diagnosis not present

## 2022-02-04 DIAGNOSIS — J019 Acute sinusitis, unspecified: Secondary | ICD-10-CM | POA: Diagnosis not present

## 2022-02-04 DIAGNOSIS — R051 Acute cough: Secondary | ICD-10-CM | POA: Diagnosis not present

## 2022-02-04 DIAGNOSIS — J029 Acute pharyngitis, unspecified: Secondary | ICD-10-CM | POA: Diagnosis not present

## 2022-03-06 ENCOUNTER — Encounter: Payer: Self-pay | Admitting: Family Medicine

## 2022-03-06 ENCOUNTER — Ambulatory Visit (INDEPENDENT_AMBULATORY_CARE_PROVIDER_SITE_OTHER): Payer: Medicare Other | Admitting: Family Medicine

## 2022-03-06 VITALS — BP 112/70 | HR 70 | Temp 97.7°F | Ht 66.25 in | Wt 121.8 lb

## 2022-03-06 DIAGNOSIS — R7303 Prediabetes: Secondary | ICD-10-CM | POA: Diagnosis not present

## 2022-03-06 LAB — POCT GLYCOSYLATED HEMOGLOBIN (HGB A1C)
HbA1c POC (<> result, manual entry): 5.6 % (ref 4.0–5.6)
HbA1c, POC (controlled diabetic range): 5.6 % (ref 0.0–7.0)
HbA1c, POC (prediabetic range): 5.6 % — AB (ref 5.7–6.4)
Hemoglobin A1C: 5.6 % (ref 4.0–5.6)

## 2022-03-06 LAB — GLUCOSE, POCT (MANUAL RESULT ENTRY): POC Glucose: 163 mg/dl — AB (ref 70–99)

## 2022-03-06 MED ORDER — FREESTYLE LIBRE 2 READER DEVI: 3 refills | Status: DC

## 2022-03-06 MED ORDER — FREESTYLE LIBRE 2 SENSOR MISC: 3 refills | Status: DC

## 2022-03-06 NOTE — Addendum Note (Signed)
Addended by: Deveron Furlong D on: 03/06/2022 10:30 AM   Modules accepted: Orders

## 2022-03-06 NOTE — Progress Notes (Signed)
OFFICE VISIT  03/06/2022  CC:  Chief Complaint  Patient presents with   Medical Management of Chronic Issues    Pt is not fasting    Patient is a 72 y.o. female who presents for 61-monthfollow-up borderline diabetes. A/P as of last visit: "1)  borderline diabetes. A1c 6.6%. No meds at this time. She will refocus on stringent low carbohydrate diet. I did prescribe a glucometer today for her to use occasionally. Plan is to return to see me in 6 months and we will do point-of-care hemoglobin A1c and point-of-care glucose at that time"  INTERIM HX: Feeling well. She has modified her diet very well, low-carb, higher vegetable and fruit,  Lower fats.  She is very active. No home glucose monitoring. Past Medical History:  Diagnosis Date   Genital herpes in women 02/17/2009   she "plays with" the dose: 1/2-1 tab per day   Haglund's deformity 10/2015   Right; with posterior calcaneal heel spur (Podiatrist)   History of fracture of phalanx of thumb 09/2016   [facility]: left thumb, possible impacted fracture of distal portion of proximal phalanx of thumb.  Splint rx'd and ortho f/u advised.   Lactose intolerance    ?   Multiple allergies 08/02/2010   Osteopenia 2014   2014 DEXA: Lowest tscore - 1.7 at left femoral neck.  07/2017 T-score -2.0 femur neck. 09/2021 T score -2.1.   Prediabetes 2014   Saw nutritionist (HbA1c 6.1% 02/2014).  A1c 5.7% 08/2015.  A1c 6.0% 08/2017. A1c 6.1% 09/2018.   Tendonitis, Achilles, right 2018   Podiatrist   Vallecular cyst 04/09/2015   ? cause of chronic cough?--Dr. GSimeon Craft(ENT) removed this and her cough resolved.    Past Surgical History:  Procedure Laterality Date   BREAST CYST ASPIRATION     broken leg  6 months old   cataract surgery  4-12 and 5-12   COLONOSCOPY  2009; 01/04/18   2009 normal.  Rpt 01/04/18 normal (Dr. MCollene Mares.  Recall 10 yrs.   COSMETIC SURGERY  72yrs old   nose   DEXA  2014; 2019   2014 T-score -1.7.   07/2017 T-score -2.0  femur neck. 09/2019 -2.1.  09/2021 -2.1   INCISION AND DRAINAGE  08/11/2016   [facility] [place]   teeth pulled  72yrs old   Vallecular cyst excision  2017   cough resolved after this was removed.    Outpatient Medications Prior to Visit  Medication Sig Dispense Refill   acyclovir (ZOVIRAX) 400 MG tablet Take 400 mg by mouth 2 (two) times daily.     Calcium Carbonate-Vitamin D (CALCIUM-VITAMIN D) 500-200 MG-UNIT per tablet Take 1 tablet by mouth 2 (two) times daily.     estradiol (ESTRACE) 0.1 MG/GM vaginal cream Place 1 Applicatorful vaginally once a week. Pt uses every other week.     hydrocortisone 2.5 % cream   2   multivitamin (THERAGRAN) tablet Take 1 tablet by mouth daily. One a day     blood glucose meter kit and supplies Use to check glucose once daily. (Patient not taking: Reported on 03/06/2022) 1 each 0   ONETOUCH VERIO test strip USE TO TEST BLOOD SUGAR UP TO 4 TIMES DAILY AS INSTRUCTED (Patient not taking: Reported on 03/06/2022) 100 strip 1   No facility-administered medications prior to visit.    No Known Allergies  Review of Systems As per HPI  PE:  03/06/2022    9:44 AM 09/06/2021    8:48 AM 08/30/2021   11:02 AM  Vitals with BMI  Height 5' 6.25" 5' 6.25" '5\' 4"'$   Weight 121 lbs 13 oz 120 lbs 118 lbs 13 oz  BMI 19.51 52.77 82.42  Systolic 353 614 431  Diastolic 70 78 68  Pulse 70 68 77    Physical Exam  Gen: Alert, well appearing.  Patient is oriented to person, place, time, and situation. AFFECT: pleasant, lucid thought and speech. No further exam today  LABS:  Last CBC Lab Results  Component Value Date   WBC 3.6 (L) 09/03/2021   HGB 12.6 09/03/2021   HCT 38.1 09/03/2021   MCV 85.2 09/03/2021   RDW 13.4 09/03/2021   PLT 200.0 54/00/8676   Last metabolic panel Lab Results  Component Value Date   GLUCOSE 94 09/03/2021   NA 139 09/03/2021   K 4.5 09/03/2021   CL 103 09/03/2021    CO2 29 09/03/2021   BUN 15 09/03/2021   CREATININE 0.76 09/03/2021   EGFR 79 08/26/2021   CALCIUM 9.6 09/03/2021   PHOS 3.6 09/20/2012   PROT 6.9 09/03/2021   ALBUMIN 4.4 09/03/2021   BILITOT 0.5 09/03/2021   ALKPHOS 67 09/03/2021   AST 20 09/03/2021   ALT 16 09/03/2021   Last lipids Lab Results  Component Value Date   CHOL 177 09/03/2021   HDL 52.30 09/03/2021   LDLCALC 109 (H) 09/03/2021   TRIG 78.0 09/03/2021   CHOLHDL 3 09/03/2021   Last hemoglobin A1c Lab Results  Component Value Date   HGBA1C 5.6 03/06/2022   HGBA1C 5.6 03/06/2022   HGBA1C 5.6 (A) 03/06/2022   HGBA1C 5.6 03/06/2022   Last thyroid functions Lab Results  Component Value Date   TSH 1.40 09/03/2021   Last vitamin D Lab Results  Component Value Date   VD25OH 44 09/20/2012   IMPRESSION AND PLAN:  Borderline diabetes, excellent response to diet over the last 6 months. Non-fasting POC glucose here today is 163. POC Hba1c here today is 5.6%. She is interested in a continuous glucose monitoring system to help better guide her dietary choices.  Gave sample package/sensor of libre CGM system to patient today and did a prescription for sensors.  An After Visit Summary was printed and given to the patient.  FOLLOW UP: Return in about 6 months (around 09/04/2022) for annual CPE (fasting).  Signed:  Crissie Sickles, MD           03/06/2022

## 2022-03-07 ENCOUNTER — Ambulatory Visit: Payer: Medicare Other | Admitting: Family Medicine

## 2022-04-09 ENCOUNTER — Ambulatory Visit (INDEPENDENT_AMBULATORY_CARE_PROVIDER_SITE_OTHER): Payer: Medicare Other

## 2022-04-09 VITALS — Wt 121.0 lb

## 2022-04-09 DIAGNOSIS — Z0000 Encounter for general adult medical examination without abnormal findings: Secondary | ICD-10-CM

## 2022-04-09 NOTE — Progress Notes (Signed)
I connected with  Jermiyah Mcalhany Paz on 04/09/22 by a audio enabled telemedicine application and verified that I am speaking with the correct person using two identifiers.  Patient Location: Home  Provider Location: Home Office  I discussed the limitations of evaluation and management by telemedicine. The patient expressed understanding and agreed to proceed.   Subjective:   Bonnie [name] is a 73 y.o. female who presents for Medicare Annual (Subsequent) preventive examination.  Review of Systems     Cardiac Risk Factors include: advanced age (>47mn, >>81women)     Objective:    Today's Vitals   04/09/22 1507  Weight: 121 lb (54.9 kg)   Body mass index is 19.38 kg/m.     04/09/2022    3:12 PM 04/03/2021    8:08 AM  Advanced Directives  Does Patient Have a Medical Advance Directive? Yes Yes  Type of AParamedicof [place]outh Cong[place] will [place]in Chart? No - copy requested No - copy requested    Current Medications (verified) Outpatient Encounter Medications as of 04/09/2022  Medication Sig   acyclovir (ZOVIRAX) 400 MG tablet Take 100 mg by mouth 2 (two) times daily.   Calcium Carbonate-Vitamin D (CALCIUM-VITAMIN D) 500-200 MG-UNIT per tablet Take 1 tablet by mouth 2 (two) times daily.   COMIRNATY SUSP injection    Continuous Blood Gluc Receiver (FREESTYLE LIBRE 2 READER) DEVI Use to check glucose daily   Continuous Blood Gluc Sensor (FREESTYLE LIBRE 2 SENSOR) MISC Use to check glucose daily.   estradiol (ESTRACE) 0.1 MG/GM vaginal cream Place 1 Applicatorful vaginally once a week. Pt uses every other week.   FLUAD QUADRIVALENT 0.5 ML injection    hydrocortisone 2.5 % cream    multivitamin (THERAGRAN) tablet Take 1 tablet by mouth daily. One a day   No facility-administered encounter medications on file as of 04/09/2022.    Allergies (verified) Patient has no known allergies.   History: Past  Medical History:  Diagnosis Date   Genital herpes in women 02/17/2009   she "plays with" the dose: 1/2-1 tab per day   Haglund's deformity 10/2015   Right; with posterior calcaneal heel spur (Podiatrist)   History of fracture of phalanx of thumb 09/2016   [facility]: left thumb, possible impacted fracture of distal portion of proximal phalanx of thumb.  Splint rx'd and ortho f/u advised.   Lactose intolerance    ?   Multiple allergies 08/02/2010   Osteopenia 2014   2014 DEXA: Lowest tscore - 1.7 at left femoral neck.  07/2017 T-score -2.0 femur neck. 09/2021 T score -2.1.   Prediabetes 2014   Saw nutritionist (HbA1c 6.1% 02/2014).  A1c 5.7% 08/2015.  A1c 6.0% 08/2017. A1c 6.1% 09/2018.   Tendonitis, Achilles, right 2018   Podiatrist   Vallecular cyst 04/09/2015   ? cause of chronic cough?--Dr. GSimeon Craft(ENT) removed this and her cough resolved.   Past Surgical History:  Procedure Laterality Date   BREAST CYST ASPIRATION     broken leg  6 months old   cataract surgery  4-12 and 5-12   COLONOSCOPY  2009; 01/04/18   2009 normal.  Rpt 01/04/18 normal (Dr. MCollene Mares.  Recall 10 yrs.   COSMETIC SURGERY  72yrs old   nose   DEXA  2014; 2019   2014 T-score -1.7.   07/2017 T-score -2.0 femur neck. 09/2019 -2.1.  09/2021 -2.1   INCISION AND DRAINAGE  08/11/2016  Epidermal inclusion cyst on back (done by MD at [facility] in [place]).   teeth pulled  72 yrs old   Vallecular cyst excision  2017   cough resolved after this was removed.   Family History  Problem Relation Age of Onset   Heart disease Father        passed after by pass surgery   Hypertension Father    Hyperlipidemia Father    Other Sister        auto immune issues   Gout Sister    Autoimmune disease Sister        fatigue, rashes   Osteopenia Sister    Breast cancer Sister    Osteoporosis Mother    Social History   Socioeconomic History   Marital status: Married    Spouse name: Not on file   Number of children:  Not on file   Years of education: Not on file   Highest education level: Master's degree (e.g., MA, MS, MEng, MEd, MSW, MBA)  Occupational History   Not on file  Tobacco Use   Smoking status: Never   Smokeless tobacco: Never  Vaping Use   Vaping Use: Never used  Substance and Sexual Activity   Alcohol use: Yes    Comment: 1-2 beers a week or 1-2 glasses of wine a week   Drug use: No   Sexual activity: Yes    Partners: Male  Other Topics Concern   Not on file  Social History Narrative   Married, 2 grown children.   Lives in [place], [place] from [place].   Occupation: Optician, dispensing   No tob, 1-2 beer/wine per week.  No hx of drug abuse or alcohol probs.   Enjoys outdoors, snow skiing, swimming, running, hiking.   Social Determinants of Health   Financial Resource Strain: Low Risk  (04/09/2022)   Overall Financial Resource Strain (CARDIA)    Difficulty of Paying Living Expenses: Not hard at all  Food Insecurity: No Food Insecurity (04/09/2022)   Hunger Vital Sign    Worried About Running Out of Food in the Last Year: Never true    Ran Out of Food in the Last Year: Never true  Transportation Needs: No Transportation Needs (04/09/2022)   PRAPARE - Hydrologist (Medical): No    Lack of Transportation (Non-Medical): No  Physical Activity: Sufficiently Active (04/09/2022)   Exercise Vital Sign    Days of Exercise per Week: 6 days    Minutes of Exercise per Session: 50 min  Stress: No Stress Concern Present (04/09/2022)   [place]    Feeling of Stress : Not at all  Social Connections: Moderately Integrated (04/09/2022)   Social Connection and Isolation Panel [NHANES]    Frequency of Communication with Friends and Family: More than three times a week    Frequency of Social Gatherings with Friends and Family: More than three times a week    Attends Religious Services: Never    Building surveyor or Organizations: Yes    Attends Music therapist: More than 4 times per year    Marital Status: Married    Tobacco Counseling Counseling given: Not Answered   Clinical Intake:  Pre-visit preparation completed: Yes  Pain : No/denies pain     BMI - recorded: 19.38 Nutritional Status: BMI of 19-24  Normal Nutritional Risks: None Diabetes: No  How often do you need  to have someone help you when you read instructions, pamphlets, or other written materials from your doctor or pharmacy?: 1 - Never  Diabetic?no  Interpreter Needed?: No  Information entered by :: Charlott Rakes, LPN   Activities of Daily Living    04/09/2022    3:14 PM  In your present state of health, do you have any difficulty performing the following activities:  Hearing? 0  Vision? 0  Difficulty concentrating or making decisions? 0  Walking or climbing stairs? 0  Dressing or bathing? 0  Doing errands, shopping? 0  Preparing Food and eating ? N  Using the Toilet? N  In the past six months, have you accidently leaked urine? N  Do you have problems with loss of bowel control? N  Managing your Medications? N  Managing your Finances? N  Housekeeping or managing your Housekeeping? N    Patient Care Team: Tammi Sou, MD as PCP - General (Family Medicine) Olga Millers, MD as Consulting Physician (Obstetrics and Gynecology) Juanita Craver, MD as Consulting Physician (Gastroenterology) Sheard, Briscoe Burns, DPM (Inactive) as Consulting Physician (Podiatry) Regal, Tamala Fothergill, DPM as Consulting Physician (Podiatry) Gregor Hams, MD as Consulting Physician (Sports Medicine)  Indicate any recent Medical Services you may have received from other than [facility] providers in the past year (date may be approximate).     Assessment:   This is a routine wellness examination for Khiyah.  Hearing/Vision screen Hearing Screening - Comments:: Pt denies any hearing issues  Vision  Screening - Comments:: Pt follows up with Dr Hoyle Sauer for annual eye exams   Dietary issues and exercise activities discussed: Current Exercise Habits: Home exercise routine, Type of exercise: Other - see comments, Time (Minutes): 50, Frequency (Times/Week): 6, Weekly Exercise (Minutes/Week): 300   Goals Addressed             This Visit's Progress    Patient Stated       Stay healthy and active        Depression Screen    04/09/2022    3:12 PM 03/06/2022    9:51 AM 04/03/2021    8:07 AM 09/05/2020   10:34 AM 09/16/2019    8:08 AM 09/01/2018    7:57 AM 09/11/2017   10:11 AM  PHQ 2/9 Scores  PHQ - 2 Score 0 0 0 0 0 0 0    Fall Risk    04/09/2022    3:14 PM 03/06/2022    9:51 AM 08/29/2021   12:41 PM 04/03/2021    8:09 AM 10/08/2020   11:39 AM  Fall Risk   Falls in the past year? 0 0 0 0 0  Number falls in past yr: 0 0  0 0  Injury with Fall? 0 0  0 0  Risk for fall due to : Impaired vision No Fall Risks  Impaired vision   Follow up Falls prevention discussed Falls evaluation completed  Falls prevention discussed Falls evaluation completed    FALL RISK PREVENTION PERTAINING TO THE HOME:  Any stairs in or around the home? Yes  If so, are there any without handrails? No  Home free of loose throw rugs in walkways, pet beds, electrical cords, etc? Yes  Adequate lighting in your home to reduce risk of falls? Yes   ASSISTIVE DEVICES UTILIZED TO PREVENT FALLS:  Life alert? No  Use of a cane, walker or w/c? No  Grab bars in the bathroom? No  Shower chair or bench in shower?  No  Elevated toilet seat or a handicapped toilet? No   TIMED UP AND GO:  Was the test performed? No .   Cognitive Function:        04/09/2022    3:14 PM 04/03/2021    8:12 AM  6CIT Screen  What Year? 0 points 0 points  What month? 0 points 0 points  What time? 0 points 0 points  Count back from 20 0 points 0 points  Months in reverse 0 points 0 points  Repeat phrase 0 points 2 points  Total  Score 0 points 2 points    Immunizations Immunization History  Administered Date(s) Administered   COVID-19, mRNA, vaccine(Comirnaty)12 years and older 12/17/2021   Fluad Quad(high Dose 65+) 12/17/2021   Hepatitis A 09/26/2005, 10/31/2005, 02/04/2010   Hepatitis B 09/26/2005, 10/31/2005, 02/04/2010   IPV 09/26/2005   Influenza Whole 11/28/2010   Influenza, High Dose Seasonal PF 11/21/2017   Influenza-Unspecified 12/16/2013, 12/17/2015, 11/21/2017, 10/31/2018, 12/13/2020   Meningococcal Conjugate 09/26/2005   Moderna Sars-Covid-2 Vaccination 04/19/2019, 05/18/2019, 12/19/2019, 06/19/2020, 12/13/2020   Pneumococcal Conjugate-13 09/11/2017   Pneumococcal Polysaccharide-23 09/16/2019   Tdap 10/31/2005, 02/17/2010, 09/05/2020   Typhoid Live 09/26/2005   Yellow Fever 09/26/2005   Zoster Recombinat (Shingrix) 11/21/2017, 03/21/2018   Zoster, Live 09/16/2012    TDAP status: Up to date  Flu Vaccine status: Up to date  Pneumococcal vaccine status: Up to date  Covid-19 vaccine status: Completed vaccines  Qualifies for Shingles Vaccine? Yes   Zostavax completed Yes   Shingrix Completed?: Yes  Screening Tests Health Maintenance  Topic Date Due   MAMMOGRAM  10/02/2022   Medicare Annual Wellness (AWV)  04/10/2023   COLONOSCOPY (Pts 45-58yr Insurance coverage will need to be confirmed)  01/05/2028   DTaP/Tdap/Td (4 - Td or Tdap) 09/06/2030   Pneumonia Vaccine 70 Years old  Completed   INFLUENZA VACCINE  Completed   DEXA SCAN  Completed   COVID-19 Vaccine  Completed   Hepatitis C Screening  Completed   Zoster Vaccines- Shingrix  Completed   HPV VACCINES  Aged Out    Health Maintenance  There are no preventive care reminders to display for this patient.   Colorectal cancer screening: Type of screening: Colonoscopy. Completed 01/04/18. Repeat every 10 years  Mammogram status: Completed 10/01/21. Repeat every year  Bone Density status: Completed 10/14/21. Results reflect:  Bone density results: OSTEOPENIA. Repeat every 2 years.   Additional Screening:  Hepatitis C Screening:  Completed 03/19/16  Vision Screening: Recommended annual ophthalmology exams for early detection of glaucoma and other disorders of the eye. Is the patient up to date with their annual eye exam?  Yes  Who is the provider or what is the name of the office in which the patient attends annual eye exams? Dr AHoyle Sauer If pt is not established with a provider, would they like to be referred to a provider to establish care? No .   Dental Screening: Recommended annual dental exams for proper oral hygiene  Community Resource Referral / Chronic Care Management: CRR required this visit?  No   CCM required this visit?  No      Plan:     I have personally reviewed and noted the following in the patient's chart:   Medical and social history Use of alcohol, tobacco or illicit drugs  Current medications and supplements including opioid prescriptions. Patient is not currently taking opioid prescriptions. Functional ability and status Nutritional status Physical activity Advanced directives List of other  physicians Hospitalizations, surgeries, and ER visits in previous 12 months Vitals Screenings to include cognitive, depression, and falls Referrals and appointments  In addition, I have reviewed and discussed with patient certain preventive protocols, quality metrics, and best practice recommendations. A written personalized care plan for preventive services as well as general preventive health recommendations were provided to patient.     Willette Brace, LPN   [id number]   Nurse Notes: none

## 2022-04-09 NOTE — Patient Instructions (Signed)
[name] , Thank you for taking time to come for your Medicare Wellness Visit. I appreciate your ongoing commitment to your health goals. Please review the following plan we discussed and let me know if I can assist you in the future.   These are the goals we discussed:  Goals      Patient Stated     None at this time      Patient Stated     Stay healthy and active         This is a list of the screening recommended for you and due dates:  Health Maintenance  Topic Date Due   Mammogram  10/02/2022   Medicare Annual Wellness Visit  04/10/2023   Colon Cancer Screening  01/05/2028   DTaP/Tdap/Td vaccine (4 - Td or Tdap) 09/06/2030   Pneumonia Vaccine  Completed   Flu Shot  Completed   DEXA scan (bone density measurement)  Completed   COVID-19 Vaccine  Completed   Hepatitis C Screening: USPSTF Recommendation to screen - Ages 81-79 yo.  Completed   Zoster (Shingles) Vaccine  Completed   HPV Vaccine  Aged Out    Advanced directives: Please bring a copy of your health care power of attorney and living will to the office at your convenience.  Conditions/risks identified: stay healthy and active   Next appointment: Follow up in one year for your annual wellness visit    Preventive Care 65 Years and Older, Female Preventive care refers to lifestyle choices and visits with your health care provider that can promote health and wellness. What does preventive care include? A yearly physical exam. This is also called an annual well check. Dental exams once or twice a year. Routine eye exams. Ask your health care provider how often you should have your eyes checked. Personal lifestyle choices, including: Daily care of your teeth and gums. Regular physical activity. Eating a healthy diet. Avoiding tobacco and drug use. Limiting alcohol use. Practicing safe sex. Taking low-dose aspirin every day. Taking vitamin and mineral supplements as recommended by your health care  provider. What happens during an annual well check? The services and screenings done by your health care provider during your annual well check will depend on your age, overall health, lifestyle risk factors, and family history of disease. Counseling  Your health care provider may ask you questions about your: Alcohol use. Tobacco use. Drug use. Emotional well-being. Home and relationship well-being. Sexual activity. Eating habits. History of falls. Memory and ability to understand (cognition). Work and work Statistician. Reproductive health. Screening  You may have the following tests or measurements: Height, weight, and BMI. Blood pressure. Lipid and cholesterol levels. These may be checked every 5 years, or more frequently if you are over 67 years old. Skin check. Lung cancer screening. You may have this screening every year starting at age 14 if you have a 30-pack-year history of smoking and currently smoke or have quit within the past 15 years. Fecal occult blood test (FOBT) of the stool. You may have this test every year starting at age 46. Flexible sigmoidoscopy or colonoscopy. You may have a sigmoidoscopy every 5 years or a colonoscopy every 10 years starting at age 79. Hepatitis C blood test. Hepatitis B blood test. Sexually transmitted disease (STD) testing. Diabetes screening. This is done by checking your blood sugar (glucose) after you have not eaten for a while (fasting). You may have this done every 1-3 years. Bone density scan. This is done to  screen for osteoporosis. You may have this done starting at age 42. Mammogram. This may be done every 1-2 years. Talk to your health care provider about how often you should have regular mammograms. Talk with your health care provider about your test results, treatment options, and if necessary, the need for more tests. Vaccines  Your health care provider may recommend certain vaccines, such as: Influenza vaccine. This is  recommended every year. Tetanus, diphtheria, and acellular pertussis (Tdap, Td) vaccine. You may need a Td booster every 10 years. Zoster vaccine. You may need this after age 82. Pneumococcal 13-valent conjugate (PCV13) vaccine. One dose is recommended after age 20. Pneumococcal polysaccharide (PPSV23) vaccine. One dose is recommended after age 13. Talk to your health care provider about which screenings and vaccines you need and how often you need them. This information is not intended to replace advice given to you by your health care provider. Make sure you discuss any questions you have with your health care provider. Document Released: 03/02/2015 Document Revised: 10/24/2015 Document Reviewed: 12/05/2014 Elsevier Interactive Patient Education  2017 [place] Prevention in the Home Falls can cause injuries. They can happen to people of all ages. There are many things you can do to make your home safe and to help prevent falls. What can I do on the outside of my home? Regularly fix the edges of walkways and driveways and fix any cracks. Remove anything that might make you trip as you walk through a door, such as a raised step or threshold. Trim any bushes or trees on the path to your home. Use bright outdoor lighting. Clear any walking paths of anything that might make someone trip, such as rocks or tools. Regularly check to see if handrails are loose or broken. Make sure that both sides of any steps have handrails. Any raised decks and porches should have guardrails on the edges. Have any leaves, snow, or ice cleared regularly. Use sand or salt on walking paths during winter. Clean up any spills in your garage right away. This includes oil or grease spills. What can I do in the bathroom? Use night lights. Install grab bars by the toilet and in the tub and shower. Do not use towel bars as grab bars. Use non-skid mats or decals in the tub or shower. If you need to sit down in  the shower, use a plastic, non-slip stool. Keep the floor dry. Clean up any water that spills on the floor as soon as it happens. Remove soap buildup in the tub or shower regularly. Attach bath mats securely with double-sided non-slip rug tape. Do not have throw rugs and other things on the floor that can make you trip. What can I do in the bedroom? Use night lights. Make sure that you have a light by your bed that is easy to reach. Do not use any sheets or blankets that are too big for your bed. They should not hang down onto the floor. Have a firm chair that has side arms. You can use this for support while you get dressed. Do not have throw rugs and other things on the floor that can make you trip. What can I do in the kitchen? Clean up any spills right away. Avoid walking on wet floors. Keep items that you use a lot in easy-to-reach places. If you need to reach something above you, use a strong step stool that has a grab bar. Keep electrical cords out of the way.  Do not use floor polish or wax that makes floors slippery. If you must use wax, use non-skid floor wax. Do not have throw rugs and other things on the floor that can make you trip. What can I do with my stairs? Do not leave any items on the stairs. Make sure that there are handrails on both sides of the stairs and use them. Fix handrails that are broken or loose. Make sure that handrails are as long as the stairways. Check any carpeting to make sure that it is firmly attached to the stairs. Fix any carpet that is loose or worn. Avoid having throw rugs at the top or bottom of the stairs. If you do have throw rugs, attach them to the floor with carpet tape. Make sure that you have a light switch at the top of the stairs and the bottom of the stairs. If you do not have them, ask someone to add them for you. What else can I do to help prevent falls? Wear shoes that: Do not have high heels. Have rubber bottoms. Are comfortable  and fit you well. Are closed at the toe. Do not wear sandals. If you use a stepladder: Make sure that it is fully opened. Do not climb a closed stepladder. Make sure that both sides of the stepladder are locked into place. Ask someone to hold it for you, if possible. Clearly mark and make sure that you can see: Any grab bars or handrails. First and last steps. Where the edge of each step is. Use tools that help you move around (mobility aids) if they are needed. These include: Canes. Walkers. Scooters. Crutches. Turn on the lights when you go into a dark area. Replace any light bulbs as soon as they burn out. Set up your furniture so you have a clear path. Avoid moving your furniture around. If any of your floors are uneven, fix them. If there are any pets around you, be aware of where they are. Review your medicines with your doctor. Some medicines can make you feel dizzy. This can increase your chance of falling. Ask your doctor what other things that you can do to help prevent falls. This information is not intended to replace advice given to you by your health care provider. Make sure you discuss any questions you have with your health care provider. Document Released: 11/30/2008 Document Revised: 07/12/2015 Document Reviewed: 03/10/2014 Elsevier Interactive Patient Education  2017 [employer].

## 2022-05-21 LAB — HM DIABETES EYE EXAM

## 2022-05-29 ENCOUNTER — Encounter: Payer: Self-pay | Admitting: Family Medicine

## 2022-06-02 ENCOUNTER — Encounter: Payer: Self-pay | Admitting: Family Medicine

## 2022-06-02 DIAGNOSIS — E119 Type 2 diabetes mellitus without complications: Secondary | ICD-10-CM

## 2022-06-02 MED ORDER — FREESTYLE LIBRE 3 SENSOR MISC: 3 refills | Status: DC

## 2022-06-02 MED ORDER — FREESTYLE LIBRE 3 READER DEVI: 1.0000 | Freq: Every day | 3 refills | Status: DC

## 2022-06-16 NOTE — Telephone Encounter (Signed)
Sorry, nutritionist referral ordered today. Please give her the number so she can call to arrange.

## 2022-06-16 NOTE — Addendum Note (Signed)
Addended by: Jeoffrey Massed on: 06/16/2022 02:13 PM   Modules accepted: Orders

## 2022-06-16 NOTE — Telephone Encounter (Signed)
Patient checking status of Freestyle Libre 3.  Patient states pharmacy told her that insurance has authorized yet.  Please call patient [phone]

## 2022-06-16 NOTE — Telephone Encounter (Signed)
No documentation in chart to provide PA was done prior to today.  Bonnie [name] (Key: Thayer Ohm) - [id number] FreeStyle Libre 3 Sensor Status: PA Request Created: April 29th, 2024 Sent: April 29th, 2024  Status currently pending, we will send you an update once your insurance sends a response.

## 2022-06-17 ENCOUNTER — Other Ambulatory Visit: Payer: Self-pay

## 2022-06-17 MED ORDER — FREESTYLE LIBRE 2 SENSOR MISC: 3 refills | Status: DC

## 2022-06-17 MED ORDER — FREESTYLE LIBRE 2 READER DEVI: 3 refills | Status: DC

## 2022-06-20 ENCOUNTER — Telehealth: Payer: Self-pay

## 2022-07-02 NOTE — Telephone Encounter (Signed)
   Please advise of alternative, if one available. PA was denied for [facility] 3 as well.

## 2022-07-02 NOTE — Telephone Encounter (Signed)
Dexcom? It is doubtful that her insurer will cover any of the continuous glucose monitors because she has prediabetes and not diabetes.

## 2022-07-02 NOTE — Telephone Encounter (Signed)
Patient following up on prior auth for [facility] 2 Sensor Patient states request was submitted beginning of May.  Please review and advise patient of status.  [phone]

## 2022-07-11 ENCOUNTER — Encounter: Payer: Self-pay | Admitting: Podiatry

## 2022-07-11 ENCOUNTER — Ambulatory Visit: Payer: Medicare Other | Admitting: Podiatry

## 2022-07-11 DIAGNOSIS — M722 Plantar fascial fibromatosis: Secondary | ICD-10-CM | POA: Diagnosis not present

## 2022-07-11 DIAGNOSIS — M7661 Achilles tendinitis, right leg: Secondary | ICD-10-CM | POA: Diagnosis not present

## 2022-07-15 NOTE — Progress Notes (Signed)
Subjective:   Patient ID: Bonnie [name], female   DOB: 72 y.o.   MRN: [id number]   HPI Patient states that she has been trying to be very active and has chronic Achilles tendinitis is getting lesions of both feet and is wondering about new orthotics   ROS      Objective:  Physical Exam    Vascular status intact with keratotic lesion submetatarsal bilateral localized diffuse with his history of Achilles tendinitis orthotics with heel lifts which are losing some cushion but overall in good condition with 2 pair     Assessment:  Inflammatory keratotic lesion formation bilateral with moderate Achilles tendinitis bilateral     Plan:  H&P reviewed debrided lesions no angiogenic bleeding discussed orthotics reviewed Achilles tendinitis do not recommend new orthotics patient will be seen back as needed

## 2022-08-14 ENCOUNTER — Encounter: Payer: Self-pay | Admitting: Skilled Nursing Facility1

## 2022-08-14 ENCOUNTER — Encounter: Payer: Medicare Other | Attending: Family Medicine | Admitting: Skilled Nursing Facility1

## 2022-08-14 VITALS — Ht 64.5 in | Wt 119.0 lb

## 2022-08-14 DIAGNOSIS — E119 Type 2 diabetes mellitus without complications: Secondary | ICD-10-CM | POA: Insufficient documentation

## 2022-08-14 NOTE — Progress Notes (Signed)
Medical Nutrition Therapy   Primary concerns today: how to reduce overall risk and can I liberalize my diet  Referral diagnosis: e11   NUTRITION ASSESSMENT    Clinical Medical Hx: DM Medications: see list Labs: A1c 5.6 Notable Signs/Symptoms: none reported   Lifestyle & Dietary Hx  Pt states she has a CGM since February. Pt states he is very careful with her diet choosing whole grains and a small amount of desserts. Pt states the last 3 months she has been very rigorous with her diet stating perhaps too rigorous. Pt states she sometimes wakes in the night very sweaty having soaked the sheets for the last couple years.  Pt states with the CGM she learned oatmeal shoots her numbers up: discussion surround what "shooting up" actually meals and the acceptable blood sugar rise and how a balanced meal reduces any spike possibilities.  Pt arrived with many well formed relevant questions which here answered to her satisfaction.   Estimated daily fluid intake:  oz Supplements:  Sleep:  Stress / self-care: fairly high from worry about her diet Current average weekly physical activity: 5-6 days a week 2-3 miles a run and in the gym 30 minutes 2 tiems a weeka dn swimming   24-Hr Dietary Recall: 3-4 slices cheese throughout the day First Meal: greek yogurt and cottage cheese with blueberries and ezekial cereal Snack 1.5-2 hours later: half and apple or wasa cracker with almond butter or hummus  Second Meal: nuts + chocolate bear bites  Snack: chicken salad or chili Third Meal: chicken breast + salad + corn  Snack:  Beverages:    NUTRITION INTERVENTION  Nutrition education (E-1) on the following topics:  Creation of balanced and diverse meals to increase the intake of nutrient-rich foods that provide essential vitamins, minerals, fiber, and phytonutrients  Variety of Fruits and Vegetables:  Aim for a colorful array of fruits and vegetables to ensure a wide range of nutrients. Include a  mix of leafy greens, berries, citrus fruits, cruciferous vegetables, and more. Whole Grains: Choose whole grains over refined grains. Examples include brown rice, quinoa, oats, whole wheat, and barley. Lean Proteins: Include lean sources of protein, such as poultry, fish, tofu, legumes, beans, lentils, and low-fat dairy products. Limit red and processed meats. Healthy Fats: Incorporate sources of healthy fats, including avocados, nuts, seeds, and olive oil. Limit saturated and trans fats found in fried and processed foods. Dairy or Dairy Alternatives: Choose low-fat or fat-free dairy products, or plant-based alternatives like almond or soy milk. Portion Control: Be mindful of portion sizes to avoid overeating. Pay attention to hunger and satisfaction cues. Limit Added Sugars: Minimize the consumption of sugary beverages, snacks, and desserts. Check food labels for added sugars and opt for natural sources of sweetness such as whole fruits. Hydration: Drink plenty of water throughout the day. Limit sugary drinks and excessive caffeine intake. Moderate Sodium Intake: Reduce the consumption of high-sodium foods. Use herbs and spices for flavor instead of excessive salt. Meal Planning and Preparation: Plan and prepare meals ahead of time to make healthier choices more convenient. Include a mix of food groups in each meal. Limit Processed Foods: Minimize the intake of highly processed and packaged foods that are often high in added sugars, salt, and unhealthy fats. Regular Physical Activity: Combine a healthy diet with regular physical activity for overall well-being. Aim for at least 150 minutes of moderate-intensity aerobic exercise per week, along with strength training. Moderation and Balance: Enjoy treats and indulgent foods in  moderation, emphasizing balance rather than strict restriction.   Most discussion centered around a healthy glucose variability and how to achieve this with  balanced meals.    Learning Style & Readiness for Change Teaching method utilized: Visual & Auditory  Demonstrated degree of understanding via: Teach Back  Barriers to learning/adherence to lifestyle change: none identified     MONITORING & EVALUATION Dietary intake, weekly physical activity  Next Steps  Patient is to call or email with any future questions or concerns.

## 2022-09-02 NOTE — Patient Instructions (Signed)

## 2022-09-05 ENCOUNTER — Ambulatory Visit (INDEPENDENT_AMBULATORY_CARE_PROVIDER_SITE_OTHER): Payer: Medicare Other | Admitting: Family Medicine

## 2022-09-05 ENCOUNTER — Encounter: Payer: Self-pay | Admitting: Family Medicine

## 2022-09-05 VITALS — BP 129/80 | HR 67 | Temp 97.9°F | Ht 65.0 in | Wt 117.0 lb

## 2022-09-05 DIAGNOSIS — Z0000 Encounter for general adult medical examination without abnormal findings: Secondary | ICD-10-CM

## 2022-09-05 DIAGNOSIS — E119 Type 2 diabetes mellitus without complications: Secondary | ICD-10-CM

## 2022-09-05 LAB — POCT GLYCOSYLATED HEMOGLOBIN (HGB A1C)
HbA1c POC (<> result, manual entry): 5.3 % (ref 4.0–5.6)
HbA1c, POC (controlled diabetic range): 5.3 % (ref 0.0–7.0)
HbA1c, POC (prediabetic range): 5.3 % — AB (ref 5.7–6.4)
Hemoglobin A1C: 5.3 % (ref 4.0–5.6)

## 2022-09-05 NOTE — Addendum Note (Signed)
Addended by: Emi Holes D on: 09/05/2022 10:45 AM   Modules accepted: Orders

## 2022-09-05 NOTE — Progress Notes (Signed)
Office Note 09/05/2022  CC:  Chief Complaint  Patient presents with   Annual Exam    Non fasting cpe.   Patient is a 72 y.o. female who is here for annual health maintenance exam and f/u borderline DM. A/P as of last visit: "Borderline diabetes, excellent response to diet over the last 6 months. Non-fasting POC glucose here today is 163. POC Hba1c here today is 5.6%. She is interested in a continuous glucose monitoring system to help better guide her dietary choices.  Gave sample package/sensor of libre CGM system to patient today and did a prescription for sensors."  INTERIM HX: Feeling well. Great diabetic diet. Exercises every day.  Past Medical History:  Diagnosis Date   Genital herpes in women 02/17/2009   she "plays with" the dose: 1/2-1 tab per day   Haglund's deformity 10/2015   Right; with posterior calcaneal heel spur (Podiatrist)   History of fracture of phalanx of thumb 09/2016   [facility]: left thumb, possible impacted fracture of distal portion of proximal phalanx of thumb.  Splint rx'd and ortho f/u advised.   Lactose intolerance    ?   Multiple allergies 08/02/2010   Osteopenia 2014   2014 DEXA: Lowest tscore - 1.7 at left femoral neck.  07/2017 T-score -2.0 femur neck. 09/2021 T score -2.1.   Prediabetes 2014   Saw nutritionist (HbA1c 6.1% 02/2014).  A1c 5.7% 08/2015.  A1c 6.0% 08/2017. A1c 6.1% 09/2018.   Tendonitis, Achilles, right 2018   Podiatrist   Vallecular cyst 04/09/2015   ? cause of chronic cough?--Dr. Emeline Darling (ENT) removed this and her cough resolved.    Past Surgical History:  Procedure Laterality Date   BREAST CYST ASPIRATION     broken leg  6 months old   cataract surgery  4-12 and 5-12   COLONOSCOPY  2009; 01/04/18   2009 normal.  Rpt 01/04/18 normal (Dr. Loreta Ave).  Recall 10 yrs.   COSMETIC SURGERY  72 yrs old   nose   DEXA  2014; 2019   2014 T-score -1.7.   07/2017 T-score -2.0 femur neck. 09/2019 -2.1.  09/2021 -2.1   INCISION AND  DRAINAGE  08/11/2016   Epidermal inclusion cyst on back (done by MD at [facility] in [place]).   teeth pulled  72 yrs old   Vallecular cyst excision  2017   cough resolved after this was removed.    Family History  Problem Relation Age of Onset   Heart disease Father        passed after by pass surgery   Hypertension Father    Hyperlipidemia Father    Other Sister        auto immune issues   Gout Sister    Autoimmune disease Sister        fatigue, rashes   Osteopenia Sister    Breast cancer Sister    Osteoporosis Mother     Social History   Socioeconomic History   Marital status: Married    Spouse name: Not on file   Number of children: Not on file   Years of education: Not on file   Highest education level: Master's degree (e.g., MA, MS, MEng, MEd, MSW, MBA)  Occupational History   Not on file  Tobacco Use   Smoking status: Never   Smokeless tobacco: Never  Vaping Use   Vaping status: Never Used  Substance and Sexual Activity   Alcohol use: Yes    Comment: 1-2 beers a week or  1-2 glasses of wine a week   Drug use: No   Sexual activity: Yes    Partners: Male  Other Topics Concern   Not on file  Social History Narrative   Married, 2 grown children.   Lives in [place], [place] from [place].   Occupation: Systems analyst   No tob, 1-2 beer/wine per week.  No hx of drug abuse or alcohol probs.   Enjoys outdoors, snow skiing, swimming, running, hiking.   Social Determinants of Health   Financial Resource Strain: Low Risk  (04/09/2022)   Overall Financial Resource Strain (CARDIA)    Difficulty of Paying Living Expenses: Not hard at all  Food Insecurity: No Food Insecurity (04/09/2022)   Hunger Vital Sign    Worried About Running Out of Food in the Last Year: Never true    Ran Out of Food in the Last Year: Never true  Transportation Needs: No Transportation Needs (04/09/2022)   PRAPARE - Administrator, Civil Service (Medical): No     Lack of Transportation (Non-Medical): No  Physical Activity: Sufficiently Active (04/09/2022)   Exercise Vital Sign    Days of Exercise per Week: 6 days    Minutes of Exercise per Session: 50 min  Stress: No Stress Concern Present (04/09/2022)   [employer] of Occupational Health - Occupational Stress Questionnaire    Feeling of Stress : Not at all  Social Connections: Moderately Integrated (04/09/2022)   Social Connection and Isolation Panel [NHANES]    Frequency of Communication with Friends and Family: More than three times a week    Frequency of Social Gatherings with Friends and Family: More than three times a week    Attends Religious Services: Never    Database administrator or Organizations: Yes    Attends Engineer, structural: More than 4 times per year    Marital Status: Married  Catering manager Violence: Not At Risk (04/09/2022)   Humiliation, Afraid, Rape, and Kick questionnaire    Fear of Current or Ex-Partner: No    Emotionally Abused: No    Physically Abused: No    Sexually Abused: No    Outpatient Medications Prior to Visit  Medication Sig Dispense Refill   acyclovir (ZOVIRAX) 400 MG tablet Take 100 mg by mouth 2 (two) times daily.     Calcium Carbonate-Vitamin D (CALCIUM-VITAMIN D) 500-200 MG-UNIT per tablet Take 1 tablet by mouth 2 (two) times daily.     COMIRNATY SUSP injection      Continuous Glucose Sensor (FREESTYLE LIBRE 2 SENSOR) MISC Use to check glucose daily. 2 each 3   estradiol (ESTRACE) 0.1 MG/GM vaginal cream Place 1 Applicatorful vaginally once a week. Pt uses every other week.     FLUAD QUADRIVALENT 0.5 ML injection      hydrocortisone 2.5 % cream   2   multivitamin (THERAGRAN) tablet Take 1 tablet by mouth daily. One a day     No facility-administered medications prior to visit.    No Known Allergies  Review of Systems  Constitutional:  Negative for appetite change, chills, fatigue and fever.  HENT:  Negative for congestion,  dental problem, ear pain and sore throat.   Eyes:  Negative for discharge, redness and visual disturbance.  Respiratory:  Negative for cough, chest tightness, shortness of breath and wheezing.   Cardiovascular:  Negative for chest pain, palpitations and leg swelling.  Gastrointestinal:  Negative for abdominal pain, blood in stool, diarrhea, nausea and vomiting.  Genitourinary:  Negative for difficulty urinating, dysuria, flank pain, frequency, hematuria and urgency.  Musculoskeletal:  Negative for arthralgias, back pain, joint swelling, myalgias and neck stiffness.  Skin:  Negative for pallor and rash.  Neurological:  Negative for dizziness, speech difficulty, weakness and headaches.  Hematological:  Negative for adenopathy. Does not bruise/bleed easily.  Psychiatric/Behavioral:  Negative for confusion and sleep disturbance. The patient is not nervous/anxious.     PE;    09/05/2022   10:05 AM 08/14/2022    3:16 PM 04/09/2022    3:07 PM  Vitals with BMI  Height 5\' 5"  5' 4.5"   Weight 117 lbs 119 lbs 121 lbs  BMI 19.47 20.12   Systolic 129    Diastolic 80    Pulse 67     Exam chaperoned by Harlene Salts, CMA  Gen: Alert, well appearing.  Patient is oriented to person, place, time, and situation. AFFECT: pleasant, lucid thought and speech. ENT: Ears: EACs clear, normal epithelium.  TMs with good light reflex and landmarks bilaterally.  Eyes: no injection, icteris, swelling, or exudate.  EOMI, PERRLA. Nose: no drainage or turbinate edema/swelling.  No injection or focal lesion.  Mouth: lips without lesion/swelling.  Oral mucosa pink and moist.  Dentition intact and without obvious caries or gingival swelling.  Oropharynx without erythema, exudate, or swelling.  Neck: supple/nontender.  No LAD, mass, or TM.  Carotid pulses 2+ bilaterally, without bruits. CV: RRR, no m/r/g.   LUNGS: CTA bilat, nonlabored resps, good aeration in all lung fields. ABD: soft, NT, ND, BS normal.  No  hepatospenomegaly or mass.  No bruits. EXT: no clubbing, cyanosis, or edema.  Musculoskeletal: no joint swelling, erythema, warmth, or tenderness.  ROM of all joints intact. Skin - no sores or suspicious lesions or rashes or color changes  Pertinent labs:  Lab Results  Component Value Date   TSH 1.40 09/03/2021   Lab Results  Component Value Date   WBC 3.6 (L) 09/03/2021   HGB 12.6 09/03/2021   HCT 38.1 09/03/2021   MCV 85.2 09/03/2021   PLT 200.0 09/03/2021   Lab Results  Component Value Date   CREATININE 0.76 09/03/2021   BUN 15 09/03/2021   NA 139 09/03/2021   K 4.5 09/03/2021   CL 103 09/03/2021   CO2 29 09/03/2021   Lab Results  Component Value Date   ALT 16 09/03/2021   AST 20 09/03/2021   ALKPHOS 67 09/03/2021   BILITOT 0.5 09/03/2021   Lab Results  Component Value Date   CHOL 177 09/03/2021   Lab Results  Component Value Date   HDL 52.30 09/03/2021   Lab Results  Component Value Date   LDLCALC 109 (H) 09/03/2021   Lab Results  Component Value Date   TRIG 78.0 09/03/2021   Lab Results  Component Value Date   CHOLHDL 3 09/03/2021   Lab Results  Component Value Date   HGBA1C 5.6 03/06/2022   HGBA1C 5.6 03/06/2022   HGBA1C 5.6 (A) 03/06/2022   HGBA1C 5.6 03/06/2022   ASSESSMENT AND PLAN:   Health maintenance exam: Reviewed age and gender appropriate health maintenance issues (prudent diet, regular exercise, health risks of tobacco and excessive alcohol, use of seatbelts, fire alarms in home, use of sunscreen).  Also reviewed age and gender appropriate health screening as well as vaccine recommendations. Vaccines: ALL UTD. Labs: fasting HP Cervical ca screening: per GYN, Breast ca screening: per GYN, due approx Aug this year. Colon ca screening: recall 2029. Osteoporosis screening: next  DEXA 2025.  2. DM 2 w/out complications.  Diet-only. POC Hba1c today is 5.3% Continue excellent diet and exercise habits.  An After Visit Summary was  printed and given to the patient.  FOLLOW UP:  No follow-ups on file.  @esig @

## 2022-09-08 LAB — MICROALBUMIN / CREATININE URINE RATIO
Creatinine, Urine: 67 mg/dL (ref 20–275)
Microalb Creat Ratio: 4 mg/g creat (ref <30)
Microalb, Ur: 0.3 mg/dL

## 2022-09-08 LAB — LIPID PANEL

## 2022-09-08 LAB — HEMOGLOBIN A1C

## 2022-09-08 LAB — COMPREHENSIVE METABOLIC PANEL

## 2022-09-08 LAB — CBC

## 2022-09-09 ENCOUNTER — Other Ambulatory Visit: Payer: Medicare Other

## 2022-09-09 DIAGNOSIS — E119 Type 2 diabetes mellitus without complications: Secondary | ICD-10-CM

## 2022-09-09 LAB — COMPREHENSIVE METABOLIC PANEL
ALT: 15 U/L (ref 0–35)
AST: 20 U/L (ref 0–37)
Albumin: 4.2 g/dL (ref 3.5–5.2)
Alkaline Phosphatase: 71 U/L (ref 39–117)
BUN: 20 mg/dL (ref 6–23)
CO2: 30 mEq/L (ref 19–32)
Calcium: 9.2 mg/dL (ref 8.4–10.5)
Chloride: 102 mEq/L (ref 96–112)
Creatinine, Ser: 0.74 mg/dL (ref 0.40–1.20)
GFR: 82.2 mL/min (ref >60.00)
Glucose, Bld: 72 mg/dL (ref 70–99)
Potassium: 4.3 mEq/L (ref 3.5–5.1)
Sodium: 138 mEq/L (ref 135–145)
Total Bilirubin: 0.5 mg/dL (ref 0.2–1.2)
Total Protein: 6.5 g/dL (ref 6.0–8.3)

## 2022-09-09 LAB — LIPID PANEL
Cholesterol: 193 mg/dL (ref 0–200)
HDL: 65 mg/dL (ref >39.00)
LDL Cholesterol: 117 mg/dL — ABNORMAL HIGH (ref 0–99)
NonHDL: 127.64
Total CHOL/HDL Ratio: 3
Triglycerides: 51 mg/dL (ref 0.0–149.0)
VLDL: 10.2 mg/dL (ref 0.0–40.0)

## 2022-09-09 LAB — HEMOGLOBIN A1C: Hgb A1c MFr Bld: 6 % (ref 4.6–6.5)

## 2022-09-09 LAB — CBC
HCT: 37.9 % (ref 36.0–46.0)
Hemoglobin: 12.4 g/dL (ref 12.0–15.0)
MCHC: 32.7 g/dL (ref 30.0–36.0)
MCV: 87.2 fl (ref 78.0–100.0)
Platelets: 188 10*3/uL (ref 150.0–400.0)
RBC: 4.35 Mil/uL (ref 3.87–5.11)
RDW: 13.3 % (ref 11.5–15.5)
WBC: 3.9 10*3/uL — ABNORMAL LOW (ref 4.0–10.5)

## 2022-09-09 NOTE — Progress Notes (Unsigned)
Pt came for labs only, tolerated draw well.   

## 2022-09-09 NOTE — Addendum Note (Signed)
Addended by: Emi Holes D on: 09/09/2022 09:00 AM   Modules accepted: Orders

## 2022-09-11 ENCOUNTER — Encounter: Payer: Self-pay | Admitting: Family Medicine

## 2022-09-22 NOTE — Telephone Encounter (Signed)
Hi [name], I want to reassure you that your cholesterol levels are fine. The normal ranges given do not apply to everyone the same. If your risk of cardiovascular disease is low (which yours is) then the cutoff for what is considered abnormal is different than a person with a moderate or high risk of cardiovascular disease.   Hope this helps. --PM

## 2022-09-22 NOTE — Telephone Encounter (Signed)
No further action needed.

## 2022-10-08 DIAGNOSIS — Z1231 Encounter for screening mammogram for malignant neoplasm of breast: Secondary | ICD-10-CM | POA: Diagnosis not present

## 2022-10-14 ENCOUNTER — Other Ambulatory Visit: Payer: Self-pay | Admitting: Obstetrics and Gynecology

## 2022-10-14 DIAGNOSIS — R928 Other abnormal and inconclusive findings on diagnostic imaging of breast: Secondary | ICD-10-CM

## 2022-10-22 ENCOUNTER — Ambulatory Visit: Admission: RE | Admit: 2022-10-22 | Payer: Medicare Other | Source: Ambulatory Visit

## 2022-10-22 DIAGNOSIS — R928 Other abnormal and inconclusive findings on diagnostic imaging of breast: Secondary | ICD-10-CM | POA: Diagnosis not present

## 2022-10-22 LAB — HM MAMMOGRAPHY

## 2022-12-23 DIAGNOSIS — L680 Hirsutism: Secondary | ICD-10-CM | POA: Diagnosis not present

## 2022-12-23 DIAGNOSIS — Z872 Personal history of diseases of the skin and subcutaneous tissue: Secondary | ICD-10-CM | POA: Diagnosis not present

## 2022-12-23 DIAGNOSIS — H01136 Eczematous dermatitis of left eye, unspecified eyelid: Secondary | ICD-10-CM | POA: Diagnosis not present

## 2022-12-23 DIAGNOSIS — H01135 Eczematous dermatitis of left lower eyelid: Secondary | ICD-10-CM | POA: Diagnosis not present

## 2022-12-23 DIAGNOSIS — L905 Scar conditions and fibrosis of skin: Secondary | ICD-10-CM | POA: Diagnosis not present

## 2022-12-23 DIAGNOSIS — L821 Other seborrheic keratosis: Secondary | ICD-10-CM | POA: Diagnosis not present

## 2022-12-23 DIAGNOSIS — H01133 Eczematous dermatitis of right eye, unspecified eyelid: Secondary | ICD-10-CM | POA: Diagnosis not present

## 2022-12-23 DIAGNOSIS — H01132 Eczematous dermatitis of right lower eyelid: Secondary | ICD-10-CM | POA: Diagnosis not present

## 2022-12-23 DIAGNOSIS — L814 Other melanin hyperpigmentation: Secondary | ICD-10-CM | POA: Diagnosis not present

## 2022-12-23 DIAGNOSIS — D225 Melanocytic nevi of trunk: Secondary | ICD-10-CM | POA: Diagnosis not present

## 2023-02-19 DIAGNOSIS — H16223 Keratoconjunctivitis sicca, not specified as Sjogren's, bilateral: Secondary | ICD-10-CM | POA: Diagnosis not present

## 2023-02-19 DIAGNOSIS — Z961 Presence of intraocular lens: Secondary | ICD-10-CM | POA: Diagnosis not present

## 2023-02-19 DIAGNOSIS — H5340 Unspecified visual field defects: Secondary | ICD-10-CM | POA: Diagnosis not present

## 2023-02-19 DIAGNOSIS — H5213 Myopia, bilateral: Secondary | ICD-10-CM | POA: Diagnosis not present

## 2023-02-19 DIAGNOSIS — H31092 Other chorioretinal scars, left eye: Secondary | ICD-10-CM | POA: Diagnosis not present

## 2023-02-19 IMAGING — MG MM DIGITAL SCREENING BILAT W/ TOMO AND CAD
8 series · 9 of 24 positions shown · non-contrast
Comparison: Previous exam(s).

CLINICAL DATA: Screening.

EXAM:
DIGITAL SCREENING BILATERAL MAMMOGRAM WITH TOMOSYNTHESIS AND CAD
TECHNIQUE: Bilateral screening digital craniocaudal and mediolateral oblique
mammograms were obtained. Bilateral screening digital breast
tomosynthesis was performed. The images were evaluated with
computer-aided detection.

[R CC synth-2D]
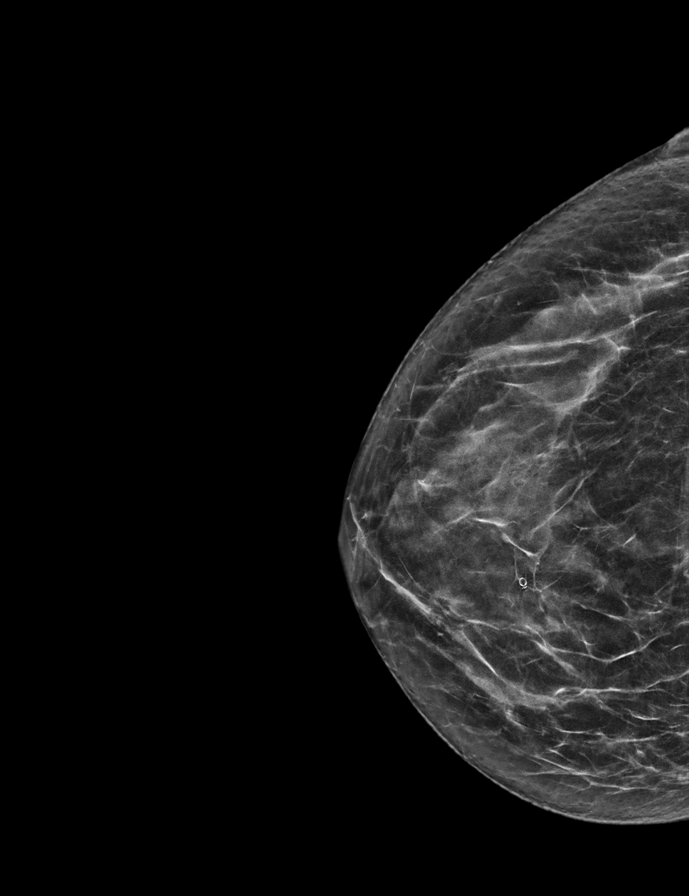

[L CC synth-2D]
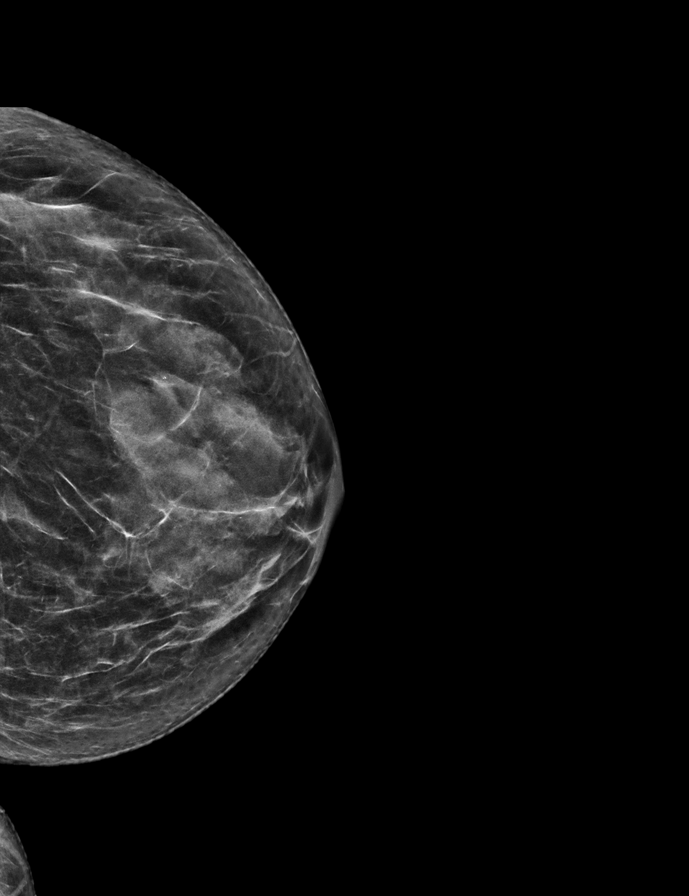

[L MLO synth-2D]
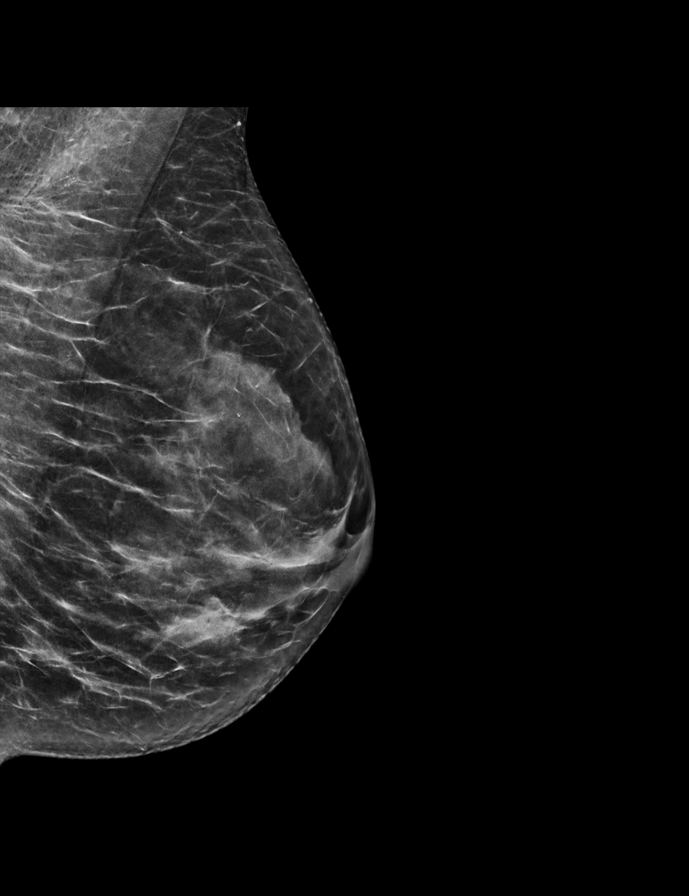

[R MLO synth-2D]
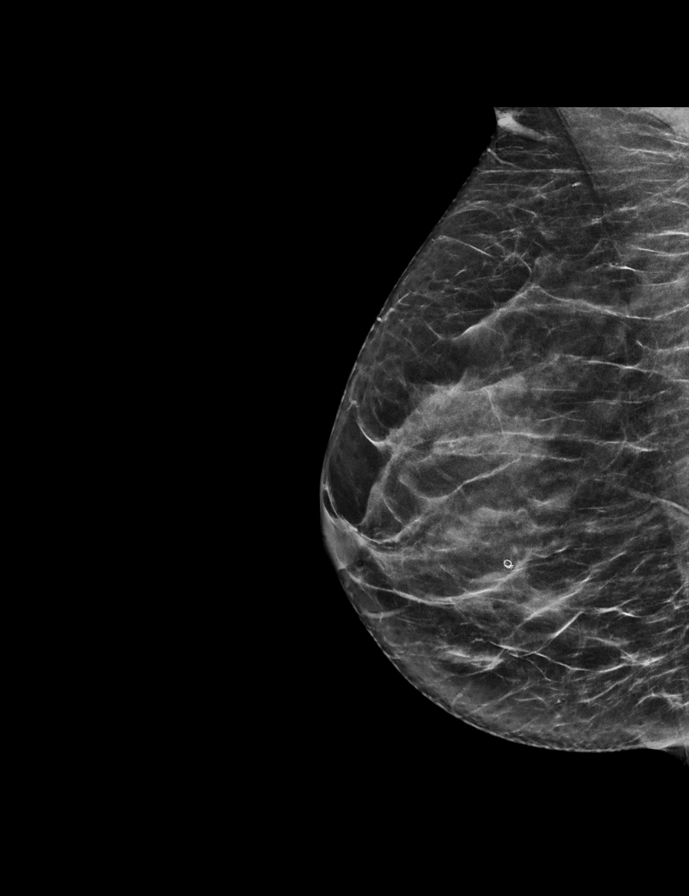

[R MLO tomo · 2 of 57 frames shown]
[frame 19/57]
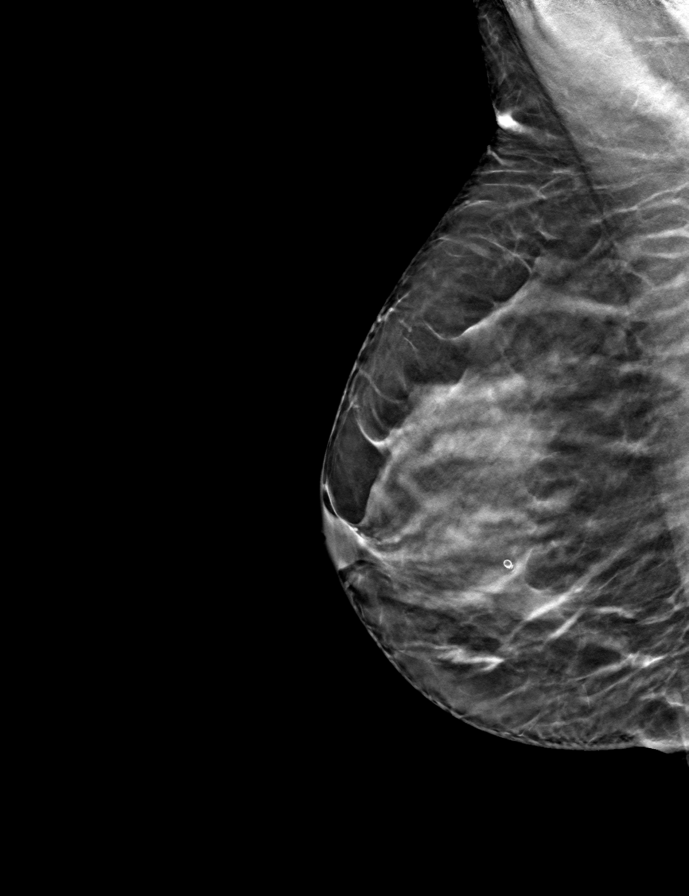
[frame 29/57]
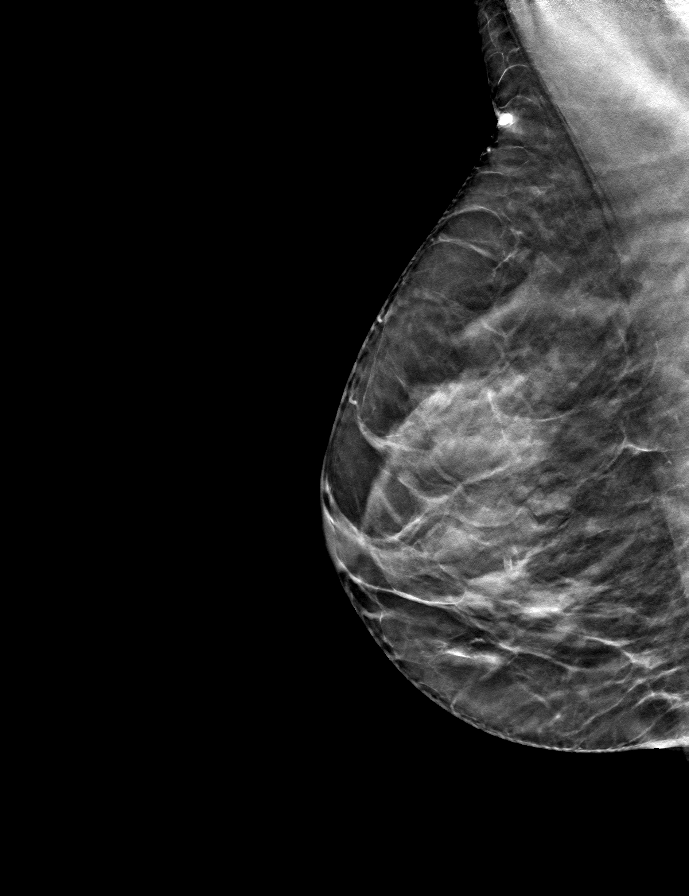

[R CC tomo · tomo slice 29/56.0]
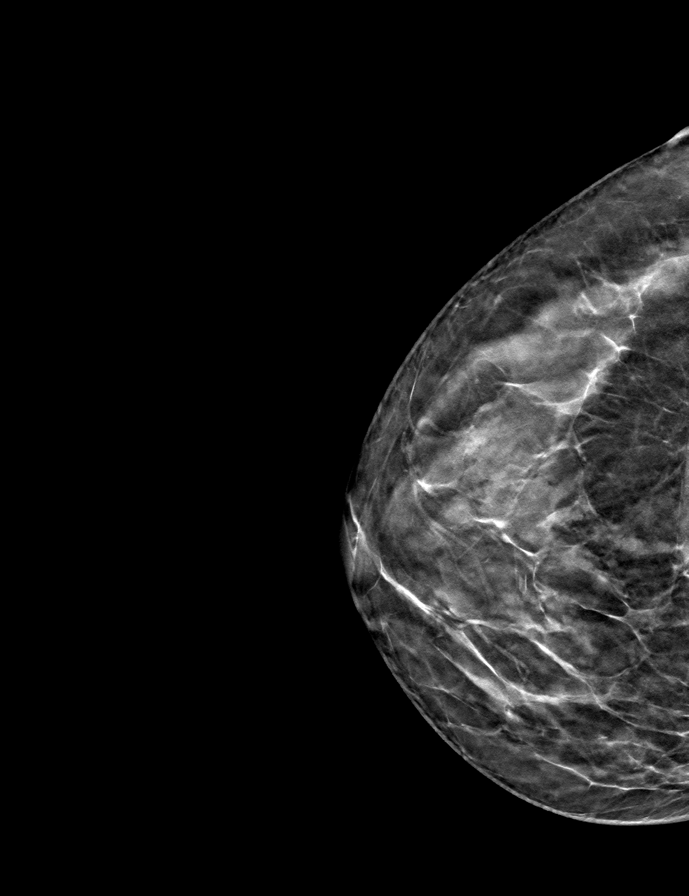

[L MLO tomo · tomo slice 30/59.0]
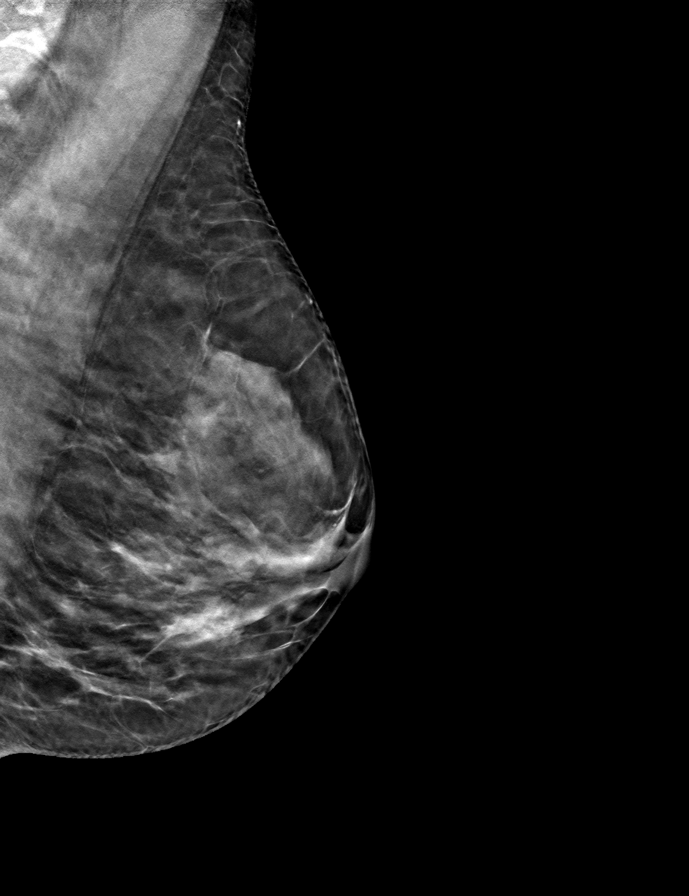

[L CC tomo · tomo slice 29/57.0]
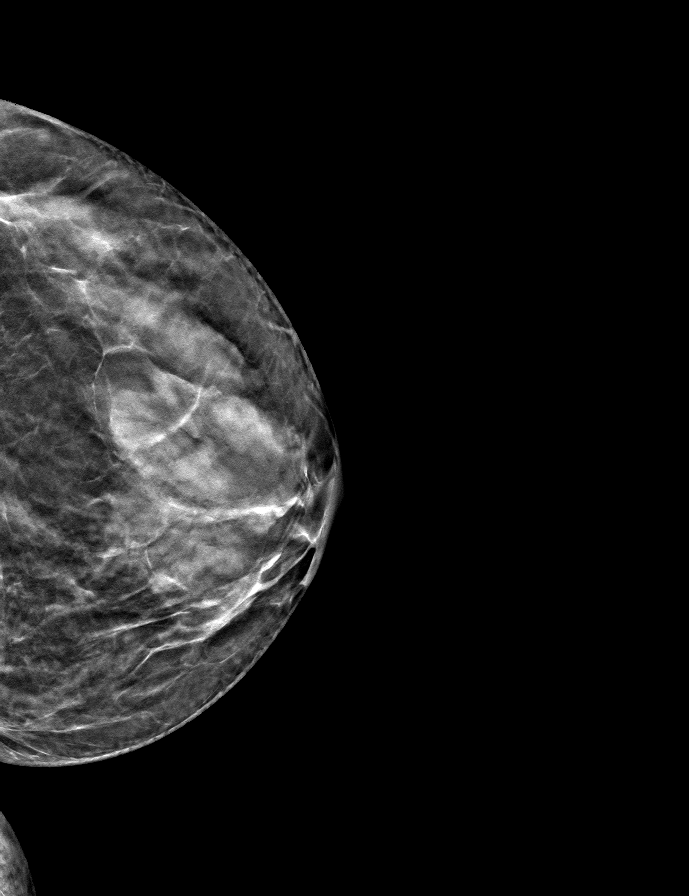

[9 of 24 positions shown; findings below may reference images not displayed]

ACR Breast Density Category c: The breast tissue is heterogeneously
dense, which may obscure small masses.
FINDINGS: There are no findings suspicious for malignancy.
IMPRESSION: No mammographic evidence of malignancy. A result letter of this
screening mammogram will be mailed directly to the patient.

RECOMMENDATION:
Screening mammogram in one year. (Code:Q3-W-BC3)

BI-RADS CATEGORY  1: Negative.

## 2023-03-03 ENCOUNTER — Telehealth: Payer: Self-pay

## 2023-03-03 NOTE — Telephone Encounter (Signed)
 Called and spoke with pt. Pt stated she would call and reschedule for her routine follow-up with her provider.

## 2023-03-10 ENCOUNTER — Ambulatory Visit: Payer: Medicare Other | Admitting: Family Medicine

## 2023-04-23 ENCOUNTER — Encounter: Payer: Self-pay | Admitting: Family Medicine

## 2023-04-23 ENCOUNTER — Ambulatory Visit: Payer: Medicare Other | Admitting: Family Medicine

## 2023-04-23 VITALS — BP 131/77 | HR 63 | Temp 98.0°F | Wt 120.2 lb

## 2023-04-23 DIAGNOSIS — R7303 Prediabetes: Secondary | ICD-10-CM

## 2023-04-23 DIAGNOSIS — R002 Palpitations: Secondary | ICD-10-CM

## 2023-04-23 LAB — COMPREHENSIVE METABOLIC PANEL
ALT: 18 U/L (ref 0–35)
AST: 20 U/L (ref 0–37)
Albumin: 4.2 g/dL (ref 3.5–5.2)
Alkaline Phosphatase: 76 U/L (ref 39–117)
BUN: 24 mg/dL — ABNORMAL HIGH (ref 6–23)
CO2: 29 meq/L (ref 19–32)
Calcium: 9.6 mg/dL (ref 8.4–10.5)
Chloride: 100 meq/L (ref 96–112)
Creatinine, Ser: 0.66 mg/dL (ref 0.40–1.20)
GFR: 88.74 mL/min (ref >60.00)
Glucose, Bld: 118 mg/dL — ABNORMAL HIGH (ref 70–99)
Potassium: 3.9 meq/L (ref 3.5–5.1)
Sodium: 138 meq/L (ref 135–145)
Total Bilirubin: 0.4 mg/dL (ref 0.2–1.2)
Total Protein: 6.4 g/dL (ref 6.0–8.3)

## 2023-04-23 LAB — HEMOGLOBIN A1C: Hgb A1c MFr Bld: 6.1 % (ref 4.6–6.5)

## 2023-04-23 NOTE — Progress Notes (Signed)
 OFFICE VISIT  04/23/2023  CC:  Chief Complaint  Patient presents with   Medical Management of Chronic Issues    Patient is a 73 y.o. female who presents for 78-month follow-up borderline diabetes.   INTERIM HX: Bonnie [name] feels well. She just got back from a trip to [place]. Good dietary and exercise habits.  Yesterday morning she was lying in bed and felt palpitations.  Duration approximately 30 to 60 seconds.  Went away when she rolled over.  No shortness of breath, dizziness, nausea, or chest pain. She does not drink caffeine.   Past Medical History:  Diagnosis Date   Genital herpes in women 02/17/2009   she "plays with" the dose: 1/2-1 tab per day   Haglund's deformity 10/2015   Right; with posterior calcaneal heel spur (Podiatrist)   History of fracture of phalanx of thumb 09/2016   [facility]: left thumb, possible impacted fracture of distal portion of proximal phalanx of thumb.  Splint rx'd and ortho f/u advised.   Lactose intolerance    ?   Multiple allergies 08/02/2010   Osteopenia 2014   2014 DEXA: Lowest tscore - 1.7 at left femoral neck.  07/2017 T-score -2.0 femur neck. 09/2021 T score -2.1.   Prediabetes 2014   Saw nutritionist (HbA1c 6.1% 02/2014).  A1c 5.7% 08/2015.  A1c 6.0% 08/2017. A1c 6.1% 09/2018.   Tendonitis, Achilles, right 2018   Podiatrist   Vallecular cyst 04/09/2015   ? cause of chronic cough?--Dr. Emeline Darling (ENT) removed this and her cough resolved.    Past Surgical History:  Procedure Laterality Date   BREAST CYST ASPIRATION     broken leg  6 months old   cataract surgery  4-12 and 5-12   COLONOSCOPY  2009; 01/04/18   2009 normal.  Rpt 01/04/18 normal (Dr. Loreta Ave).  Recall 10 yrs.   COSMETIC SURGERY  73 yrs old   nose   DEXA  2014; 2019   2014 T-score -1.7.   07/2017 T-score -2.0 femur neck. 09/2019 -2.1.  09/2021 -2.1   INCISION AND DRAINAGE  08/11/2016   Epidermal inclusion cyst on back (done by MD at [facility] in [place]).   teeth  pulled  73 yrs old   Vallecular cyst excision  2017   cough resolved after this was removed.    Outpatient Medications Prior to Visit  Medication Sig Dispense Refill   acyclovir (ZOVIRAX) 400 MG tablet Take 100 mg by mouth 2 (two) times daily.     Calcium Carbonate-Vitamin D (CALCIUM-VITAMIN D) 500-200 MG-UNIT per tablet Take 1 tablet by mouth 2 (two) times daily.     estradiol (ESTRACE) 0.1 MG/GM vaginal cream Place 1 Applicatorful vaginally once a week. Pt uses every other week.     hydrocortisone 2.5 % cream   2   multivitamin (THERAGRAN) tablet Take 1 tablet by mouth daily. One a day     Continuous Glucose Sensor (FREESTYLE LIBRE 3 SENSOR) MISC PLACE 1 SENSOR ON THE SKIN EVERY 14 DAYS. USE TO CHECK GLUCOSE CONTINUOUSLY (Patient not taking: Reported on 04/23/2023)     COMIRNATY SUSP injection      Continuous Glucose Sensor (FREESTYLE LIBRE 2 SENSOR) MISC Use to check glucose daily. 2 each 3   FLUAD QUADRIVALENT 0.5 ML injection      No facility-administered medications prior to visit.    No Known Allergies  Review of Systems As per HPI  PE:    04/23/2023    1:44 PM 09/05/2022   10:05 AM  08/14/2022    3:16 PM  Vitals with BMI  Height  5\' 5"  5' 4.5"  Weight 120 lbs 3 oz 117 lbs 119 lbs  BMI  19.47 20.12  Systolic 131 129   Diastolic 77 80   Pulse 63 67     Physical Exam  Gen: Alert, well appearing.  Patient is oriented to person, place, time, and situation. AFFECT: pleasant, lucid thought and speech. CV: RRR, no m/r/g.   LUNGS: CTA bilat, nonlabored resps, good aeration in all lung fields. EXT: no clubbing or cyanosis.  No edema.    LABS:  Last CBC Lab Results  Component Value Date   WBC 3.9 (L) 09/09/2022   HGB 12.4 09/09/2022   HCT 37.9 09/09/2022   MCV 87.2 09/09/2022   RDW 13.3 09/09/2022   PLT 188.0 09/09/2022   Last metabolic panel Lab Results  Component Value Date   GLUCOSE 72 09/09/2022   NA 138 09/09/2022   K 4.3 09/09/2022   CL 102 09/09/2022    CO2 30 09/09/2022   BUN 20 09/09/2022   CREATININE 0.74 09/09/2022   GFR 82.20 09/09/2022   CALCIUM 9.2 09/09/2022   PHOS 3.6 09/20/2012   PROT 6.5 09/09/2022   ALBUMIN 4.2 09/09/2022   BILITOT 0.5 09/09/2022   ALKPHOS 71 09/09/2022   AST 20 09/09/2022   ALT 15 09/09/2022   Last lipids Lab Results  Component Value Date   CHOL 193 09/09/2022   HDL 65.00 09/09/2022   LDLCALC 117 (H) 09/09/2022   TRIG 51.0 09/09/2022   CHOLHDL 3 09/09/2022   Last hemoglobin A1c Lab Results  Component Value Date   HGBA1C 6.0 09/09/2022   Last thyroid functions Lab Results  Component Value Date   TSH 1.40 09/03/2021   Last vitamin D Lab Results  Component Value Date   VD25OH 44 09/20/2012   IMPRESSION AND PLAN:  1) Borderline DM 2 w/out complications.  Diet-only. POC Hba1c improved from 6.0% to 5.3% about 8 months ago. Check Hba1c today.  2) Palpitations. Brief isolated episode w/out any accompanying symptoms. Monitor for recurrences/worsening and make return appt if happening > 2 times a week, getting any dizziness/SOB/Chest pain with it, or if triggered by exercise.  An After Visit Summary was printed and given to the patient.  FOLLOW UP: Return in about 6 months (around 10/24/2023) for annual CPE (fasting). Next CPE 08/2023 Signed:  Santiago Bumpers, MD           04/23/2023

## 2023-04-26 ENCOUNTER — Encounter: Payer: Self-pay | Admitting: Family Medicine

## 2023-08-05 LAB — HM DIABETES EYE EXAM

## 2023-08-09 ENCOUNTER — Encounter: Payer: Self-pay | Admitting: Family Medicine

## 2023-08-09 DIAGNOSIS — R7303 Prediabetes: Secondary | ICD-10-CM

## 2023-08-10 NOTE — Telephone Encounter (Signed)
That's fine, labs ordered.

## 2023-09-03 ENCOUNTER — Other Ambulatory Visit

## 2023-09-03 DIAGNOSIS — R7303 Prediabetes: Secondary | ICD-10-CM | POA: Diagnosis not present

## 2023-09-03 LAB — COMPREHENSIVE METABOLIC PANEL WITH GFR
ALT: 20 U/L (ref 0–35)
AST: 21 U/L (ref 0–37)
Albumin: 4.2 g/dL (ref 3.5–5.2)
Alkaline Phosphatase: 67 U/L (ref 39–117)
BUN: 21 mg/dL (ref 6–23)
CO2: 29 meq/L (ref 19–32)
Calcium: 9.2 mg/dL (ref 8.4–10.5)
Chloride: 101 meq/L (ref 96–112)
Creatinine, Ser: 0.77 mg/dL (ref 0.40–1.20)
GFR: 77.83 mL/min (ref >60.00)
Glucose, Bld: 92 mg/dL (ref 70–99)
Potassium: 4.4 meq/L (ref 3.5–5.1)
Sodium: 137 meq/L (ref 135–145)
Total Bilirubin: 0.7 mg/dL (ref 0.2–1.2)
Total Protein: 6.4 g/dL (ref 6.0–8.3)

## 2023-09-03 LAB — CBC WITH DIFFERENTIAL/PLATELET
Basophils Absolute: 0.1 K/uL (ref 0.0–0.1)
Basophils Relative: 1.7 % (ref 0.0–3.0)
Eosinophils Absolute: 0.1 K/uL (ref 0.0–0.7)
Eosinophils Relative: 2.2 % (ref 0.0–5.0)
HCT: 37 % (ref 36.0–46.0)
Hemoglobin: 12.3 g/dL (ref 12.0–15.0)
Lymphocytes Relative: 31.4 % (ref 12.0–46.0)
Lymphs Abs: 1.3 K/uL (ref 0.7–4.0)
MCHC: 33.2 g/dL (ref 30.0–36.0)
MCV: 85.1 fl (ref 78.0–100.0)
Monocytes Absolute: 0.3 K/uL (ref 0.1–1.0)
Monocytes Relative: 7.7 % (ref 3.0–12.0)
Neutro Abs: 2.4 K/uL (ref 1.4–7.7)
Neutrophils Relative %: 57 % (ref 43.0–77.0)
Platelets: 194 K/uL (ref 150.0–400.0)
RBC: 4.34 Mil/uL (ref 3.87–5.11)
RDW: 13.8 % (ref 11.5–15.5)
WBC: 4.3 K/uL (ref 4.0–10.5)

## 2023-09-03 LAB — LIPID PANEL
Cholesterol: 206 mg/dL — ABNORMAL HIGH (ref 0–200)
HDL: 73.2 mg/dL (ref >39.00)
LDL Cholesterol: 123 mg/dL — ABNORMAL HIGH (ref 0–99)
NonHDL: 132.81
Total CHOL/HDL Ratio: 3
Triglycerides: 50 mg/dL (ref 0.0–149.0)
VLDL: 10 mg/dL (ref 0.0–40.0)

## 2023-09-03 LAB — HEMOGLOBIN A1C: Hgb A1c MFr Bld: 6.1 % (ref 4.6–6.5)

## 2023-09-04 ENCOUNTER — Other Ambulatory Visit

## 2023-09-08 ENCOUNTER — Encounter: Payer: Self-pay | Admitting: Family Medicine

## 2023-09-08 ENCOUNTER — Ambulatory Visit (INDEPENDENT_AMBULATORY_CARE_PROVIDER_SITE_OTHER): Admitting: Family Medicine

## 2023-09-08 VITALS — BP 105/69 | HR 73 | Temp 97.9°F | Ht 65.5 in | Wt 118.4 lb

## 2023-09-08 DIAGNOSIS — Z9189 Other specified personal risk factors, not elsewhere classified: Secondary | ICD-10-CM

## 2023-09-08 DIAGNOSIS — M8588 Other specified disorders of bone density and structure, other site: Secondary | ICD-10-CM

## 2023-09-08 DIAGNOSIS — Z0000 Encounter for general adult medical examination without abnormal findings: Secondary | ICD-10-CM | POA: Diagnosis not present

## 2023-09-08 DIAGNOSIS — R7303 Prediabetes: Secondary | ICD-10-CM | POA: Diagnosis not present

## 2023-09-08 DIAGNOSIS — E348 Other specified endocrine disorders: Secondary | ICD-10-CM | POA: Diagnosis not present

## 2023-09-08 DIAGNOSIS — E7800 Pure hypercholesterolemia, unspecified: Secondary | ICD-10-CM | POA: Diagnosis not present

## 2023-09-08 LAB — MICROALBUMIN / CREATININE URINE RATIO
Creatinine,U: 76.1 mg/dL
Microalb Creat Ratio: UNDETERMINED mg/g (ref 0.0–30.0)
Microalb, Ur: <0.7 mg/dL

## 2023-09-08 NOTE — Progress Notes (Signed)
 Office Note 09/08/2023  CC:  Chief Complaint  Patient presents with   Annual Exam    Pt is not fasting   Patient is a 73 y.o. female who is here for annual health maintenance exam and 9-month follow-up borderline diabetes. A/P as of last visit: 1) Borderline DM 2 w/out complications.  Diet-only. POC Hba1c improved from 6.0% to 5.3% about 8 months ago. Check Hba1c today.   2) Palpitations. Brief isolated episode w/out any accompanying symptoms. Monitor for recurrences/worsening and make return appt if happening > 2 times a week, getting any dizziness/SOB/Chest pain with it, or if triggered by exercise.  INTERIM HX: Bonnie [name] feels well.  Great diet and exercise habits.  We discussed her recent labs in detail today.   Past Medical History:  Diagnosis Date   Genital herpes in women 02/17/2009   she plays with the dose: 1/2-1 tab per day   Haglund's deformity 10/2015   Right; with posterior calcaneal heel spur (Podiatrist)   History of fracture of phalanx of thumb 09/2016   [facility]: left thumb, possible impacted fracture of distal portion of proximal phalanx of thumb.  Splint rx'd and ortho f/u advised.   Lactose intolerance    ?   Multiple allergies 08/02/2010   Osteopenia 2014   2014 DEXA: Lowest tscore - 1.7 at left femoral neck.  07/2017 T-score -2.0 femur neck. 09/2021 T score -2.1.   Prediabetes 2014   Saw nutritionist (HbA1c 6.1% 02/2014).  A1c 5.7% 08/2015.  A1c 6.0% 08/2017. A1c 6.1% 09/2018.   Tendonitis, Achilles, right 2018   Podiatrist   Vallecular cyst 04/09/2015   ? cause of chronic cough?--Dr. Lazaro (ENT) removed this and her cough resolved.    Past Surgical History:  Procedure Laterality Date   BREAST CYST ASPIRATION     broken leg  6 months old   cataract surgery  4-12 and 5-12   COLONOSCOPY  2009; 01/04/18   2009 normal.  Rpt 01/04/18 normal (Dr. Kristie).  Recall 10 yrs.   COSMETIC SURGERY  73 yrs old   nose   DEXA  2014; 2019   2014 T-score  -1.7.   07/2017 T-score -2.0 femur neck. 09/2019 -2.1.  09/2021 -2.1   INCISION AND DRAINAGE  08/11/2016   Epidermal inclusion cyst on back (done by MD at [facility] in [place]).   teeth pulled  73 yrs old   Vallecular cyst excision  2017   cough resolved after this was removed.    Family History  Problem Relation Age of Onset   Heart disease Father        passed after by pass surgery   Hypertension Father    Hyperlipidemia Father    Other Sister        auto immune issues   Gout Sister    Autoimmune disease Sister        fatigue, rashes   Osteopenia Sister    Breast cancer Sister    Osteoporosis Mother     Social History   Socioeconomic History   Marital status: Married    Spouse name: Not on file   Number of children: Not on file   Years of education: Not on file   Highest education level: Master's degree (e.g., MA, MS, MEng, MEd, MSW, MBA)  Occupational History   Not on file  Tobacco Use   Smoking status: Never   Smokeless tobacco: Never  Vaping Use   Vaping status: Never Used  Substance and Sexual Activity  Alcohol use: Yes    Comment: 1-2 beers a week or 1-2 glasses of wine a week   Drug use: No   Sexual activity: Yes    Partners: Male  Other Topics Concern   Not on file  Social History Narrative   Married, 2 grown children.   Lives in [place], [place] from [place] .   Occupation: Systems analyst   No tob, 1-2 beer/wine per week.  No hx of drug abuse or alcohol probs.   Enjoys outdoors, snow skiing, swimming, running, hiking.   Social Drivers of Corporate investment banker Strain: Low Risk  (09/07/2023)   Overall Financial Resource Strain (CARDIA)    Difficulty of Paying Living Expenses: Not hard at all  Food Insecurity: No Food Insecurity (09/07/2023)   Hunger Vital Sign    Worried About Running Out of Food in the Last Year: Never true    Ran Out of Food in the Last Year: Never true  Transportation Needs: No Transportation Needs (09/07/2023)    PRAPARE - Administrator, Civil Service (Medical): No    Lack of Transportation (Non-Medical): No  Physical Activity: Sufficiently Active (09/07/2023)   Exercise Vital Sign    Days of Exercise per Week: 6 days    Minutes of Exercise per Session: 30 min  Stress: No Stress Concern Present (09/07/2023)   [employer] of Occupational Health - Occupational Stress Questionnaire    Feeling of Stress: Only a little  Social Connections: Moderately Integrated (09/07/2023)   Social Connection and Isolation Panel    Frequency of Communication with Friends and Family: More than three times a week    Frequency of Social Gatherings with Friends and Family: Twice a week    Attends Religious Services: Never    Database administrator or Organizations: Yes    Attends Engineer, structural: More than 4 times per year    Marital Status: Married  Catering manager Violence: Not At Risk (04/09/2022)   Humiliation, Afraid, Rape, and Kick questionnaire    Fear of Current or Ex-Partner: No    Emotionally Abused: No    Physically Abused: No    Sexually Abused: No    Outpatient Medications Prior to Visit  Medication Sig Dispense Refill   acyclovir (ZOVIRAX) 400 MG tablet Take 100 mg by mouth 2 (two) times daily.     Calcium Carbonate-Vitamin D  (CALCIUM-VITAMIN D ) 500-200 MG-UNIT per tablet Take 1 tablet by mouth 2 (two) times daily.     estradiol (ESTRACE) 0.1 MG/GM vaginal cream Place 1 Applicatorful vaginally once a week. Pt uses every other week.     hydrocortisone  2.5 % cream   2   multivitamin (THERAGRAN) tablet Take 1 tablet by mouth daily. One a day     Continuous Glucose Sensor (FREESTYLE LIBRE 3 SENSOR) MISC PLACE 1 SENSOR ON THE SKIN EVERY 14 DAYS. USE TO CHECK GLUCOSE CONTINUOUSLY (Patient not taking: Reported on 09/08/2023)     No facility-administered medications prior to visit.    No Known Allergies  Review of Systems  Constitutional:  Negative for appetite change,  chills, fatigue and fever.  HENT:  Negative for congestion, dental problem, ear pain and sore throat.   Eyes:  Negative for discharge, redness and visual disturbance.  Respiratory:  Negative for cough, chest tightness, shortness of breath and wheezing.   Cardiovascular:  Negative for chest pain, palpitations and leg swelling.  Gastrointestinal:  Negative for abdominal pain, blood in stool, diarrhea, nausea and  vomiting.  Genitourinary:  Negative for difficulty urinating, dysuria, flank pain, frequency, hematuria and urgency.  Musculoskeletal:  Negative for arthralgias, back pain, joint swelling, myalgias and neck stiffness.  Skin:  Negative for pallor and rash.  Neurological:  Negative for dizziness, speech difficulty, weakness and headaches.  Hematological:  Negative for adenopathy. Does not bruise/bleed easily.  Psychiatric/Behavioral:  Negative for confusion and sleep disturbance. The patient is not nervous/anxious.    PE;    09/08/2023    9:45 AM 04/23/2023    1:44 PM 09/05/2022   10:05 AM  Vitals with BMI  Height 5' 5.5  5' 5  Weight 118 lbs 6 oz 120 lbs 3 oz 117 lbs  BMI 19.4  19.47  Systolic 105 131 870  Diastolic 69 77 80  Pulse 73 63 67   Exam chaperoned by Jesusa Hahn, CMA  Gen: Alert, well appearing.  Patient is oriented to person, place, time, and situation. AFFECT: pleasant, lucid thought and speech. ENT: Ears: EACs clear, normal epithelium.  TMs with good light reflex and landmarks bilaterally.  Eyes: no injection, icteris, swelling, or exudate.  EOMI, PERRLA. Nose: no drainage or turbinate edema/swelling.  No injection or focal lesion.  Mouth: lips without lesion/swelling.  Oral mucosa pink and moist.  Dentition intact and without obvious caries or gingival swelling.  Oropharynx without erythema, exudate, or swelling.  Neck: supple/nontender.  No LAD, mass, or TM.  Carotid pulses 2+ bilaterally, without bruits. CV: RRR, no m/r/g.   LUNGS: CTA bilat, nonlabored resps,  good aeration in all lung fields. ABD: soft, NT, ND, BS normal.  No hepatospenomegaly or mass.  No bruits. EXT: no clubbing, cyanosis, or edema.  Musculoskeletal: no joint swelling, erythema, warmth, or tenderness.  ROM of all joints intact. Skin - no sores or suspicious lesions or rashes or color changes  Pertinent labs:  Lab Results  Component Value Date   TSH 1.40 09/03/2021   Lab Results  Component Value Date   WBC 4.3 09/03/2023   HGB 12.3 09/03/2023   HCT 37.0 09/03/2023   MCV 85.1 09/03/2023   PLT 194.0 09/03/2023   Lab Results  Component Value Date   CREATININE 0.77 09/03/2023   BUN 21 09/03/2023   NA 137 09/03/2023   K 4.4 09/03/2023   CL 101 09/03/2023   CO2 29 09/03/2023   Lab Results  Component Value Date   ALT 20 09/03/2023   AST 21 09/03/2023   ALKPHOS 67 09/03/2023   BILITOT 0.7 09/03/2023   Lab Results  Component Value Date   CHOL 206 (H) 09/03/2023   Lab Results  Component Value Date   HDL 73.20 09/03/2023   Lab Results  Component Value Date   LDLCALC 123 (H) 09/03/2023   Lab Results  Component Value Date   TRIG 50.0 09/03/2023   Lab Results  Component Value Date   CHOLHDL 3 09/03/2023   Lab Results  Component Value Date   HGBA1C 6.1 09/03/2023   ASSESSMENT AND PLAN:   Health maintenance exam: Reviewed age and gender appropriate health maintenance issues (prudent diet, regular exercise, health risks of tobacco and excessive alcohol, use of seatbelts, fire alarms in home, use of sunscreen).  Also reviewed age and gender appropriate health screening as well as vaccine recommendations. Vaccines: ALL UTD. Labs: fasting HP 09/03/2023--> reviewed results in detail today. Cervical ca screening: per GYN, Breast ca screening: per GYN, scheduled for tomorrow. Colon ca screening: recall 2029. Osteoporosis screening: She has osteopenia and is due  for her DEXA--> ordered today..  #2 borderline diabetes. Hemoglobin A1c stable at 6.1%. No  medications. Urine microalbumin/creatinine today  #3 hypercholesterolemia.  LDL 123. 10-year Framingham cardiovascular risk= 7.5%. She would like to avoid statin if at all possible. Ordered coronary calcium score today.  An After Visit Summary was printed and given to the patient.  FOLLOW UP:  Return in about 6 months (around 03/10/2024) for routine chronic illness f/u.  Signed:  Gerlene Hockey, MD           09/08/2023

## 2023-09-09 ENCOUNTER — Ambulatory Visit: Payer: Self-pay | Admitting: Family Medicine

## 2023-09-09 DIAGNOSIS — Z1231 Encounter for screening mammogram for malignant neoplasm of breast: Secondary | ICD-10-CM | POA: Diagnosis not present

## 2023-09-09 DIAGNOSIS — B9689 Other specified bacterial agents as the cause of diseases classified elsewhere: Secondary | ICD-10-CM | POA: Diagnosis not present

## 2023-09-22 ENCOUNTER — Other Ambulatory Visit (HOSPITAL_BASED_OUTPATIENT_CLINIC_OR_DEPARTMENT_OTHER)

## 2023-10-06 ENCOUNTER — Ambulatory Visit (HOSPITAL_BASED_OUTPATIENT_CLINIC_OR_DEPARTMENT_OTHER)
Admission: RE | Admit: 2023-10-06 | Discharge: 2023-10-06 | Disposition: A | Discharge status: Home / Self Care | Source: Ambulatory Visit | Attending: Family Medicine | Admitting: Family Medicine

## 2023-10-06 DIAGNOSIS — E7800 Pure hypercholesterolemia, unspecified: Secondary | ICD-10-CM | POA: Insufficient documentation

## 2023-10-06 DIAGNOSIS — Z9189 Other specified personal risk factors, not elsewhere classified: Secondary | ICD-10-CM | POA: Insufficient documentation

## 2023-10-08 ENCOUNTER — Encounter: Payer: Self-pay | Admitting: Family Medicine

## 2023-10-09 NOTE — Telephone Encounter (Signed)
 FYI  Please see below

## 2023-10-14 DIAGNOSIS — S70362A Insect bite (nonvenomous), left thigh, initial encounter: Secondary | ICD-10-CM | POA: Diagnosis not present

## 2023-10-14 DIAGNOSIS — L814 Other melanin hyperpigmentation: Secondary | ICD-10-CM | POA: Diagnosis not present

## 2023-10-14 DIAGNOSIS — L821 Other seborrheic keratosis: Secondary | ICD-10-CM | POA: Diagnosis not present

## 2023-10-14 DIAGNOSIS — D2372 Other benign neoplasm of skin of left lower limb, including hip: Secondary | ICD-10-CM | POA: Diagnosis not present

## 2023-10-20 ENCOUNTER — Other Ambulatory Visit (HOSPITAL_BASED_OUTPATIENT_CLINIC_OR_DEPARTMENT_OTHER)

## 2023-11-03 ENCOUNTER — Ambulatory Visit (HOSPITAL_BASED_OUTPATIENT_CLINIC_OR_DEPARTMENT_OTHER)
Admission: RE | Admit: 2023-11-03 | Discharge: 2023-11-03 | Disposition: A | Discharge status: Home / Self Care | Source: Ambulatory Visit | Attending: Family Medicine | Admitting: Family Medicine

## 2023-11-03 DIAGNOSIS — M8588 Other specified disorders of bone density and structure, other site: Secondary | ICD-10-CM | POA: Insufficient documentation

## 2023-11-03 DIAGNOSIS — E348 Other specified endocrine disorders: Secondary | ICD-10-CM | POA: Insufficient documentation

## 2023-11-03 DIAGNOSIS — Z780 Asymptomatic menopausal state: Secondary | ICD-10-CM | POA: Diagnosis not present

## 2023-11-03 DIAGNOSIS — M8589 Other specified disorders of bone density and structure, multiple sites: Secondary | ICD-10-CM | POA: Diagnosis not present

## 2023-11-04 ENCOUNTER — Encounter: Payer: Self-pay | Admitting: Family Medicine

## 2023-12-02 ENCOUNTER — Ambulatory Visit: Admitting: *Deleted

## 2023-12-02 DIAGNOSIS — Z0000 Encounter for general adult medical examination without abnormal findings: Secondary | ICD-10-CM | POA: Diagnosis not present

## 2023-12-02 NOTE — Telephone Encounter (Signed)
 No further action needed.

## 2023-12-02 NOTE — Progress Notes (Signed)
 Subjective:   Bonnie [name] is a 73 y.o. female who presents for Medicare Annual (Subsequent) preventive examination.  Visit Complete: Virtual I connected with  Vandana Haman Turck on 12/02/23 by a audio enabled telemedicine application and verified that I am speaking with the correct person using two identifiers.  Patient Location: Home  Provider Location: Home Office  I discussed the limitations of evaluation and management by telemedicine. The patient expressed understanding and agreed to proceed.  Vital Signs: Because this visit was a virtual/telehealth visit, some criteria may be missing or patient reported. Any vitals not documented were not able to be obtained and vitals that have been documented are patient reported.   Cardiac Risk Factors include: advanced age (>56men, >80 women)     Objective:    There were no vitals filed for this visit. There is no height or weight on file to calculate BMI.     12/02/2023    3:56 PM 08/14/2022    3:21 PM 04/09/2022    3:12 PM 04/03/2021    8:08 AM  Advanced Directives  Does Patient Have a Medical Advance Directive? Yes No Yes Yes  Type of Estate agent of [employer] Power of [place];Living will Healthcare Power of Attorney  Copy of Healthcare Power of Attorney in Chart? No - copy requested  No - copy requested No - copy requested  Would patient like information on creating a medical advance directive?  No - Patient declined      Current Medications (verified) Outpatient Encounter Medications as of 12/02/2023  Medication Sig   acyclovir (ZOVIRAX) 400 MG tablet Take 100 mg by mouth 2 (two) times daily.   Calcium Carbonate-Vitamin D  (CALCIUM-VITAMIN D ) 500-200 MG-UNIT per tablet Take 1 tablet by mouth 2 (two) times daily.   estradiol (ESTRACE) 0.1 MG/GM vaginal cream Place 1 Applicatorful vaginally once a week. Pt uses every other week.   Ferrous Sulfate (SLOW FE PO) Take by mouth.   hydrocortisone  2.5 %  cream    multivitamin (THERAGRAN) tablet Take 1 tablet by mouth daily. One a day   Continuous Glucose Sensor (FREESTYLE LIBRE 3 SENSOR) MISC PLACE 1 SENSOR ON THE SKIN EVERY 14 DAYS. USE TO CHECK GLUCOSE CONTINUOUSLY (Patient not taking: Reported on 12/02/2023)   No facility-administered encounter medications on file as of 12/02/2023.    Allergies (verified) Patient has no known allergies.   History: Past Medical History:  Diagnosis Date   Genital herpes in women 02/17/2009   she plays with the dose: 1/2-1 tab per day   Haglund's deformity 10/2015   Right; with posterior calcaneal heel spur (Podiatrist)   History of fracture of phalanx of thumb 09/2016   [facility]: left thumb, possible impacted fracture of distal portion of proximal phalanx of thumb.  Splint rx'd and ortho f/u advised.   Lactose intolerance    ?   Multiple allergies 08/02/2010   Osteopenia 2014   2014 DEXA: Lowest tscore - 1.7 at left femoral neck.  07/2017 T-score -2.0 femur neck. 09/2021 T score -2.1.  T-score September 2025 is -2.3.   Prediabetes 2014   Saw nutritionist (HbA1c 6.1% 02/2014).  A1c 5.7% 08/2015.  A1c 6.0% 08/2017. A1c 6.1% 09/2018.   Tendonitis, Achilles, right 2018   Podiatrist   Vallecular cyst 04/09/2015   ? cause of chronic cough?--Dr. Lazaro (ENT) removed this and her cough resolved.   Past Surgical History:  Procedure Laterality Date   BREAST CYST ASPIRATION     broken  leg  6 months old   cataract surgery  4-12 and 5-12   COLONOSCOPY  2009; 01/04/18   2009 normal.  Rpt 01/04/18 normal (Dr. Kristie).  Recall 10 yrs.   Coronary calcium score     09/2023 LAD 23, 51st percentile   COSMETIC SURGERY  73 yrs old   nose   DEXA  2014; 2019   2014 T-score -1.7.   07/2017 T-score -2.0 femur neck. 09/2019 -2.1.  09/2021 -2.1.  10/2023 -2.3.   INCISION AND DRAINAGE  08/11/2016   Epidermal inclusion cyst on back (done by MD at [facility] in [place]).   teeth pulled  73 yrs old   Vallecular  cyst excision  2017   cough resolved after this was removed.   Family History  Problem Relation Age of Onset   Heart disease Father        passed after by pass surgery   Hypertension Father    Hyperlipidemia Father    Other Sister        auto immune issues   Gout Sister    Autoimmune disease Sister        fatigue, rashes   Osteopenia Sister    Breast cancer Sister    Osteoporosis Mother    Social History   Socioeconomic History   Marital status: Married    Spouse name: Not on file   Number of children: Not on file   Years of education: Not on file   Highest education level: Master's degree (e.g., MA, MS, MEng, MEd, MSW, MBA)  Occupational History   Not on file  Tobacco Use   Smoking status: Never   Smokeless tobacco: Never  Vaping Use   Vaping status: Never Used  Substance and Sexual Activity   Alcohol use: Yes    Comment: 1-2 beers a week or 1-2 glasses of wine a week   Drug use: No   Sexual activity: Yes    Partners: Male  Other Topics Concern   Not on file  Social History Narrative   Married, 2 grown children.   Lives in [place], [place] from [place] .   Occupation: market research   No tob, 1-2 beer/wine per week.  No hx of drug abuse or alcohol probs.   Enjoys outdoors, snow skiing, swimming, running, hiking.   Social Drivers of Corporate investment banker Strain: Low Risk  (12/02/2023)   Overall Financial Resource Strain (CARDIA)    Difficulty of Paying Living Expenses: Not hard at all  Food Insecurity: No Food Insecurity (12/02/2023)   Hunger Vital Sign    Worried About Running Out of Food in the Last Year: Never true    Ran Out of Food in the Last Year: Never true  Transportation Needs: No Transportation Needs (12/02/2023)   PRAPARE - Administrator, Civil Service (Medical): No    Lack of Transportation (Non-Medical): No  Physical Activity: Sufficiently Active (12/02/2023)   Exercise Vital Sign    Days of Exercise per Week: 4 days     Minutes of Exercise per Session: 60 min  Stress: No Stress Concern Present (12/02/2023)   [employer] of Occupational Health - Occupational Stress Questionnaire    Feeling of Stress: Not at all  Social Connections: Moderately Integrated (12/02/2023)   Social Connection and Isolation Panel    Frequency of Communication with Friends and Family: More than three times a week    Frequency of Social Gatherings with Friends and Family: More  than three times a week    Attends Religious Services: Never    Active Member of Clubs or Organizations: Yes    Attends Banker Meetings: More than 4 times per year    Marital Status: Married    Tobacco Counseling Counseling given: Not Answered   Clinical Intake:                        Activities of Daily Living    12/02/2023    3:56 PM  In your present state of health, do you have any difficulty performing the following activities:  Hearing? 0  Vision? 0  Difficulty concentrating or making decisions? 0  Walking or climbing stairs? 0  Dressing or bathing? 0  Doing errands, shopping? 0  Preparing Food and eating ? N  Using the Toilet? N  In the past six months, have you accidently leaked urine? N  Do you have problems with loss of bowel control? N  Managing your Medications? N  Managing your Finances? N  Housekeeping or managing your Housekeeping? N    Patient Care Team: Candise Aleene DEL, MD as PCP - General (Family Medicine) Lenon Oneil BRAVO, MD as Consulting Physician (Obstetrics and Gynecology) Kristie Lamprey, MD as Consulting Physician (Gastroenterology) Sheard, Jaylene KIDD, DPM (Inactive) as Consulting Physician (Podiatry) Regal, [place] RAMAN, DPM as Consulting Physician (Podiatry) Joane Artist RAMAN, MD as Consulting Physician (Sports Medicine)  Indicate any recent Medical Services you may have received from other than [facility] providers in the past year (date may be approximate).     Assessment:   This is a  routine wellness examination for [place].  Hearing/Vision screen Hearing Screening - Comments:: No trouble hearing Vision Screening - Comments:: [facility] Up to date   Goals Addressed             This Visit's Progress    Patient Stated       None at this time       Depression Screen    12/02/2023    4:00 PM 09/08/2023    9:45 AM 04/23/2023    1:47 PM 09/05/2022   10:12 AM 08/14/2022    3:21 PM 04/09/2022    3:12 PM 03/06/2022    9:51 AM  PHQ 2/9 Scores  PHQ - 2 Score 0 0 0 0 0 0 0  PHQ- 9 Score 0  0 0       Fall Risk    12/02/2023    3:52 PM 09/08/2023    9:45 AM 09/05/2022   10:12 AM 08/14/2022    3:21 PM 04/09/2022    3:14 PM  Fall Risk   Falls in the past year? 0 0 0 0 0  Number falls in past yr: 0 0 0  0  Injury with Fall? 0 0 0  0  Risk for fall due to :  No Fall Risks No Fall Risks  Impaired vision  Follow up Falls evaluation completed;Education provided;Falls prevention discussed Falls evaluation completed Falls evaluation completed  Falls prevention discussed    MEDICARE RISK AT HOME: Medicare Risk at Home Any stairs in or around the home?: Yes If so, are there any without handrails?: Yes Home free of loose throw rugs in walkways, pet beds, electrical cords, etc?: Yes Adequate lighting in your home to reduce risk of falls?: Yes Life alert?: No Use of a cane, walker or w/c?: No Grab bars in the bathroom?: No Shower chair or bench in shower?:  No Elevated toilet seat or a handicapped toilet?: No  TIMED UP AND GO:  Was the test performed?  No    Cognitive Function:        12/02/2023    3:57 PM 04/09/2022    3:14 PM 04/03/2021    8:12 AM  6CIT Screen  What Year? 0 points 0 points 0 points  What month? 0 points 0 points 0 points  What time? 0 points 0 points 0 points  Count back from 20 0 points 0 points 0 points  Months in reverse 0 points 0 points 0 points  Repeat phrase 0 points 0 points 2 points  Total Score 0 points 0 points 2 points     Immunizations Immunization History  Administered Date(s) Administered   Fluad Quad(high Dose 65+) 12/17/2021   Hepatitis A 09/26/2005, 10/31/2005, 02/04/2010   Hepatitis B 09/26/2005, 10/31/2005, 02/04/2010   INFLUENZA, HIGH DOSE SEASONAL PF 11/21/2017, 12/07/2022   IPV 09/26/2005   Influenza Whole 11/28/2010   Influenza-Unspecified 12/16/2013, 12/17/2015, 11/21/2017, 10/31/2018, 12/13/2020   Meningococcal Conjugate 09/26/2005   Moderna Sars-Covid-2 Vaccination 04/19/2019, 05/18/2019, 12/19/2019, 06/19/2020, 12/13/2020   Pfizer(Comirnaty)Fall Seasonal Vaccine 12 years and older 12/17/2021, 12/07/2022   Pneumococcal Conjugate-13 09/11/2017   Pneumococcal Polysaccharide-23 09/16/2019   Tdap 10/31/2005, 02/17/2010, 09/05/2020   Typhoid Live 09/26/2005   Yellow Fever 09/26/2005   Zoster Recombinant(Shingrix) 11/21/2017, 03/21/2018   Zoster, Live 09/16/2012    TDAP status: Up to date  Flu Vaccine status: Due, Education has been provided regarding the importance of this vaccine. Advised may receive this vaccine at local pharmacy or Health Dept. Aware to provide a copy of the vaccination record if obtained from local pharmacy or Health Dept. Verbalized acceptance and understanding.  Pneumococcal vaccine status: Up to date  Covid-19 vaccine status: Information provided on how to obtain vaccines.   Qualifies for Shingles Vaccine? No   Zostavax completed Yes   Shingrix Completed?: No.    Education has been provided regarding the importance of this vaccine. Patient has been advised to call insurance company to determine out of pocket expense if they have not yet received this vaccine. Advised may also receive vaccine at local pharmacy or Health Dept. Verbalized acceptance and understanding.  Screening Tests Health Maintenance  Topic Date Due   Influenza Vaccine  09/18/2023   COVID-19 Vaccine (8 - 2025-26 season) 10/19/2023   Mammogram  10/22/2023   Diabetic kidney evaluation - eGFR  measurement  09/02/2024   Diabetic kidney evaluation - Urine ACR  09/07/2024   Medicare Annual Wellness (AWV)  12/01/2024   Colonoscopy  01/05/2028   DTaP/Tdap/Td (4 - Td or Tdap) 09/06/2030   Pneumococcal Vaccine: 50+ Years  Completed   DEXA SCAN  Completed   Hepatitis C Screening  Completed   Zoster Vaccines- Shingrix  Completed   Meningococcal B Vaccine  Aged Out   Hepatitis B Vaccines 19-59 Average Risk  Discontinued    Health Maintenance  Health Maintenance Due  Topic Date Due   Influenza Vaccine  09/18/2023   COVID-19 Vaccine (8 - 2025-26 season) 10/19/2023   Mammogram  10/22/2023    Colorectal cancer screening: Type of screening: Colonoscopy. Completed 2019. Repeat every 10 years  Mammogram status: Completed  . Repeat every year  Bone Density completed 2025  repeat every 2 years  Lung Cancer Screening: (Low Dose CT Chest recommended if Age 49-80 years, 20 pack-year currently smoking OR have quit w/in 15years.) does not qualify.   Lung Cancer Screening Referral:  Additional Screening:  Hepatitis C Screening: does not qualify; Completed 2018  Vision Screening: Recommended annual ophthalmology exams for early detection of glaucoma and other disorders of the eye. Is the patient up to date with their annual eye exam?  Yes  Who is the provider or what is the name of the office in which the patient attends annual eye exams? [facility] If pt is not established with a provider, would they like to be referred to a provider to establish care? No .   Dental Screening: Recommended annual dental exams for proper oral hygiene    Community Resource Referral / Chronic Care Management: CRR required this visit?  No   CCM required this visit?  No     Plan:     I have personally reviewed and noted the following in the patient's chart:   Medical and social history Use of alcohol, tobacco or illicit drugs  Current medications and supplements including opioid prescriptions.  Patient is not currently taking opioid prescriptions. Functional ability and status Nutritional status Physical activity Advanced directives List of other physicians Hospitalizations, surgeries, and ER visits in previous 12 months Vitals Screenings to include cognitive, depression, and falls Referrals and appointments  In addition, I have reviewed and discussed with patient certain preventive protocols, quality metrics, and best practice recommendations. A written personalized care plan for preventive services as well as general preventive health recommendations were provided to patient.     Mliss Graff, LPN   [id number]   After Visit Summary: (MyChart) Due to this being a telephonic visit, the after visit summary with patients personalized plan was offered to patient via MyChart   Nurse Notes:

## 2023-12-02 NOTE — Patient Instructions (Signed)
 Bonnie [name] , Thank you for taking time to come for your Medicare Wellness Visit. I appreciate your ongoing commitment to your health goals. Please review the following plan we discussed and let me know if I can assist you in the future.   Screening recommendations/referrals: Colonoscopy:  Mammogram:  Bone Density:  Recommended yearly ophthalmology/optometry visit for glaucoma screening and checkup Recommended yearly dental visit for hygiene and checkup  Vaccinations: Influenza vaccine:  Pneumococcal vaccine:  Tdap vaccine:  Shingles vaccine:       Preventive Care 65 Years and Older, Female Preventive care refers to lifestyle choices and visits with your health care provider that can promote health and wellness. What does preventive care include? A yearly physical exam. This is also called an annual well check. Dental exams once or twice a year. Routine eye exams. Ask your health care provider how often you should have your eyes checked. Personal lifestyle choices, including: Daily care of your teeth and gums. Regular physical activity. Eating a healthy diet. Avoiding tobacco and drug use. Limiting alcohol use. Practicing safe sex. Taking low-dose aspirin  every day. Taking vitamin and mineral supplements as recommended by your health care provider. What happens during an annual well check? The services and screenings done by your health care provider during your annual well check will depend on your age, overall health, lifestyle risk factors, and family history of disease. Counseling  Your health care provider may ask you questions about your: Alcohol use. Tobacco use. Drug use. Emotional well-being. Home and relationship well-being. Sexual activity. Eating habits. History of falls. Memory and ability to understand (cognition). Work and work Astronomer. Reproductive health. Screening  You may have the following tests or measurements: Height, weight, and BMI. Blood  pressure. Lipid and cholesterol levels. These may be checked every 5 years, or more frequently if you are over 43 years old. Skin check. Lung cancer screening. You may have this screening every year starting at age 61 if you have a 30-pack-year history of smoking and currently smoke or have quit within the past 15 years. Fecal occult blood test (FOBT) of the stool. You may have this test every year starting at age 76. Flexible sigmoidoscopy or colonoscopy. You may have a sigmoidoscopy every 5 years or a colonoscopy every 10 years starting at age 33. Hepatitis C blood test. Hepatitis B blood test. Sexually transmitted disease (STD) testing. Diabetes screening. This is done by checking your blood sugar (glucose) after you have not eaten for a while (fasting). You may have this done every 1-3 years. Bone density scan. This is done to screen for osteoporosis. You may have this done starting at age 44. Mammogram. This may be done every 1-2 years. Talk to your health care provider about how often you should have regular mammograms. Talk with your health care provider about your test results, treatment options, and if necessary, the need for more tests. Vaccines  Your health care provider may recommend certain vaccines, such as: Influenza vaccine. This is recommended every year. Tetanus, diphtheria, and acellular pertussis (Tdap, Td) vaccine. You may need a Td booster every 10 years. Zoster vaccine. You may need this after age 3. Pneumococcal 13-valent conjugate (PCV13) vaccine. One dose is recommended after age 32. Pneumococcal polysaccharide (PPSV23) vaccine. One dose is recommended after age 78. Talk to your health care provider about which screenings and vaccines you need and how often you need them. This information is not intended to replace advice given to you by your health care  provider. Make sure you discuss any questions you have with your health care provider. Document Released:  03/02/2015 Document Revised: 10/24/2015 Document Reviewed: 12/05/2014 Elsevier Interactive Patient Education  2017 [employer].  Fall Prevention in the Home Falls can cause injuries. They can happen to people of all ages. There are many things you can do to make your home safe and to help prevent falls. What can I do on the outside of my home? Regularly fix the edges of walkways and driveways and fix any cracks. Remove anything that might make you trip as you walk through a door, such as a raised step or threshold. Trim any bushes or trees on the path to your home. Use bright outdoor lighting. Clear any walking paths of anything that might make someone trip, such as rocks or tools. Regularly check to see if handrails are loose or broken. Make sure that both sides of any steps have handrails. Any raised decks and porches should have guardrails on the edges. Have any leaves, snow, or ice cleared regularly. Use sand or salt on walking paths during winter. Clean up any spills in your garage right away. This includes oil or grease spills. What can I do in the bathroom? Use night lights. Install grab bars by the toilet and in the tub and shower. Do not use towel bars as grab bars. Use non-skid mats or decals in the tub or shower. If you need to sit down in the shower, use a plastic, non-slip stool. Keep the floor dry. Clean up any water that spills on the floor as soon as it happens. Remove soap buildup in the tub or shower regularly. Attach bath mats securely with double-sided non-slip rug tape. Do not have throw rugs and other things on the floor that can make you trip. What can I do in the bedroom? Use night lights. Make sure that you have a light by your bed that is easy to reach. Do not use any sheets or blankets that are too big for your bed. They should not hang down onto the floor. Have a firm chair that has side arms. You can use this for support while you get dressed. Do not have  throw rugs and other things on the floor that can make you trip. What can I do in the kitchen? Clean up any spills right away. Avoid walking on wet floors. Keep items that you use a lot in easy-to-reach places. If you need to reach something above you, use a strong step stool that has a grab bar. Keep electrical cords out of the way. Do not use floor polish or wax that makes floors slippery. If you must use wax, use non-skid floor wax. Do not have throw rugs and other things on the floor that can make you trip. What can I do with my stairs? Do not leave any items on the stairs. Make sure that there are handrails on both sides of the stairs and use them. Fix handrails that are broken or loose. Make sure that handrails are as long as the stairways. Check any carpeting to make sure that it is firmly attached to the stairs. Fix any carpet that is loose or worn. Avoid having throw rugs at the top or bottom of the stairs. If you do have throw rugs, attach them to the floor with carpet tape. Make sure that you have a light switch at the top of the stairs and the bottom of the stairs. If you do not have  them, ask someone to add them for you. What else can I do to help prevent falls? Wear shoes that: Do not have high heels. Have rubber bottoms. Are comfortable and fit you well. Are closed at the toe. Do not wear sandals. If you use a stepladder: Make sure that it is fully opened. Do not climb a closed stepladder. Make sure that both sides of the stepladder are locked into place. Ask someone to hold it for you, if possible. Clearly mark and make sure that you can see: Any grab bars or handrails. First and last steps. Where the edge of each step is. Use tools that help you move around (mobility aids) if they are needed. These include: Canes. Walkers. Scooters. Crutches. Turn on the lights when you go into a dark area. Replace any light bulbs as soon as they burn out. Set up your furniture so  you have a clear path. Avoid moving your furniture around. If any of your floors are uneven, fix them. If there are any pets around you, be aware of where they are. Review your medicines with your doctor. Some medicines can make you feel dizzy. This can increase your chance of falling. Ask your doctor what other things that you can do to help prevent falls. This information is not intended to replace advice given to you by your health care provider. Make sure you discuss any questions you have with your health care provider. Document Released: 11/30/2008 Document Revised: 07/12/2015 Document Reviewed: 03/10/2014 Elsevier Interactive Patient Education  2017 [employer].

## 2024-01-11 NOTE — Progress Notes (Signed)
 Bonnie [name]                                          MRN: [id number]   01/11/2024   The [facility] Quality Team Specialist reviewed this patient medical record for the purposes of chart review for care gap closure. The following were reviewed: abstraction for care gap closure-glycemic status assessment. KED closed in [facility] portal    [facility] Quality Team

## 2024-02-01 ENCOUNTER — Encounter: Payer: Self-pay | Admitting: Sports Medicine

## 2024-02-01 ENCOUNTER — Ambulatory Visit: Admitting: Sports Medicine

## 2024-02-01 VITALS — Temp 97.8°F | Wt 127.0 lb

## 2024-02-01 DIAGNOSIS — Z800 Family history of malignant neoplasm of digestive organs: Secondary | ICD-10-CM | POA: Insufficient documentation

## 2024-02-01 DIAGNOSIS — R2681 Unsteadiness on feet: Secondary | ICD-10-CM

## 2024-02-01 DIAGNOSIS — R0982 Postnasal drip: Secondary | ICD-10-CM | POA: Diagnosis not present

## 2024-02-01 LAB — CBC WITH DIFFERENTIAL/PLATELET
Basophils Absolute: 0.1 K/uL (ref 0.0–0.1)
Basophils Relative: 1.7 % (ref 0.0–3.0)
Eosinophils Absolute: 0.1 K/uL (ref 0.0–0.7)
Eosinophils Relative: 3.4 % (ref 0.0–5.0)
HCT: 38.6 % (ref 36.0–46.0)
Hemoglobin: 12.7 g/dL (ref 12.0–15.0)
Lymphocytes Relative: 41.6 % (ref 12.0–46.0)
Lymphs Abs: 1.7 K/uL (ref 0.7–4.0)
MCHC: 33 g/dL (ref 30.0–36.0)
MCV: 85.6 fl (ref 78.0–100.0)
Monocytes Absolute: 0.4 K/uL (ref 0.1–1.0)
Monocytes Relative: 9.6 % (ref 3.0–12.0)
Neutro Abs: 1.8 K/uL (ref 1.4–7.7)
Neutrophils Relative %: 43.7 % (ref 43.0–77.0)
Platelets: 196 K/uL (ref 150.0–400.0)
RBC: 4.51 Mil/uL (ref 3.87–5.11)
RDW: 13.8 % (ref 11.5–15.5)
WBC: 4 K/uL (ref 4.0–10.5)

## 2024-02-01 LAB — BASIC METABOLIC PANEL WITH GFR
BUN: 21 mg/dL (ref 6–23)
CO2: 32 meq/L (ref 19–32)
Calcium: 9.5 mg/dL (ref 8.4–10.5)
Chloride: 104 meq/L (ref 96–112)
Creatinine, Ser: 0.71 mg/dL (ref 0.40–1.20)
GFR: 85.54 mL/min (ref >60.00)
Glucose, Bld: 96 mg/dL (ref 70–99)
Potassium: 4.7 meq/L (ref 3.5–5.1)
Sodium: 141 meq/L (ref 135–145)

## 2024-02-01 MED ORDER — AZELASTINE HCL 0.1 % NA SOLN: 1.0000 | Freq: Two times a day (BID) | NASAL | 12 refills | Status: AC

## 2024-02-01 MED ORDER — LORATADINE 10 MG PO TABS: 10.0000 mg | ORAL_TABLET | Freq: Every day | ORAL | 11 refills | Status: AC

## 2024-02-01 NOTE — Progress Notes (Signed)
 Careteam: Patient Care Team: Candise Aleene DEL, MD as PCP - General (Family Medicine) Lenon Oneil BRAVO, MD as Consulting Physician (Obstetrics and Gynecology) Kristie Lamprey, MD as Consulting Physician (Gastroenterology) Minus Jaylene KIDD, DPM (Inactive) as Consulting Physician (Podiatry) Regal, [place] RAMAN, DPM as Consulting Physician (Podiatry) Joane Artist RAMAN, MD as Consulting Physician (Sports Medicine)  Allergies[1]  Chief Complaint  Patient presents with   Dizziness    Pt has been having balance issues. The past few days. She has started back taking meclizine purchased otc and seems to be better.    Discussed the use of AI scribe software for clinical note transcription with the patient, who gave verbal consent to proceed.  History of Present Illness  Bonnie [name] is a 73 year old female who presents with vertigo and balance issues.  She has been experiencing vertigo and balance issues for the past month to six weeks. The vertigo occurs in the morning, characterized by a sensation of the room spinning, especially when turning her head, and typically resolves by 8 to 10 AM. Initially, she also experienced vertigo during the night when turning her head. More recently, she feels more unsteady rather than experiencing vertigo, which lasts for a couple of hours after waking. She feels unsteady on her feet, particularly when running or focusing straight ahead, and this sensation resolves completely by mid-morning.  She exercises five to six days a week and has noticed unsteadiness during gym activities, which her trainer has also observed. She drinks about three to four glasses of water daily.  She has a history of sinus congestion, often feeling it in her ears, and experiences ear pain during flights. She reports a sensation of post-nasal drip and a persistent tickle in her throat, which sometimes causes her to cough. She previously tried Allegra and Flonase  for these symptoms without  relief.    No diarrhea or loose stools. No recent changes in her voice, swallowing difficulties, or severe headaches.  Pt denies chest pain, palpitations, SOB, abdominal pain nausea, vomiting, dysuria, hematuria, bloody or dark stools.    Review of Systems:  Review of Systems  Constitutional:  Negative for chills and fever.  HENT:  Negative for congestion, sinus pain and sore throat.        Post nasal drip  Eyes:  Negative for blurred vision and double vision.  Respiratory:  Negative for cough, sputum production and shortness of breath.   Cardiovascular:  Negative for chest pain, palpitations and leg swelling.  Gastrointestinal:  Negative for abdominal pain, heartburn and nausea.  Genitourinary:  Negative for dysuria, frequency and hematuria.  Musculoskeletal:  Negative for falls and myalgias.  Neurological:  Negative for dizziness, sensory change and focal weakness.       Vertigo and balance problems   Negative unless indicated in HPI.   Patient Active Problem List   Diagnosis Date Noted   Constipation 07/11/2020   Second degree hemorrhoids 07/11/2020   Rectal bleeding 07/11/2020   Osteopenia 09/10/2017   Mallet deformity of thumb, left, acquired 09/25/2016   Pain in thumb joint with movement, left 09/25/2016   Impaired fasting glucose    Health maintenance examination 09/06/2013   Polyuria 08/26/2011   Otitis externa of left ear 08/26/2011   Neck pain, acute 12/11/2010   Neutropenia 08/02/2010   Dehydration 08/02/2010   Preventative health care 08/02/2010   Fibrocystic breast disease 08/02/2010   History of colon polyps 08/02/2010   Lactose intolerance    Chicken pox  Measles    Genital herpes in women 02/17/2009   Past Medical History:  Diagnosis Date   Arthritis    mild in left knee, and big toes   Cataract 2010   lens replaced both eyes   Genital herpes in women 02/17/2009   she plays with the dose: 1/2-1 tab per day   Haglund's deformity 10/2015    Right; with posterior calcaneal heel spur (Podiatrist)   History of fracture of phalanx of thumb 09/2016   [facility]: left thumb, possible impacted fracture of distal portion of proximal phalanx of thumb.  Splint rx'd and ortho f/u advised.   Lactose intolerance    ?   Multiple allergies 08/02/2010   Osteopenia 2014   2014 DEXA: Lowest tscore - 1.7 at left femoral neck.  07/2017 T-score -2.0 femur neck. 09/2021 T score -2.1.  T-score September 2025 is -2.3.   Prediabetes 2014   Saw nutritionist (HbA1c 6.1% 02/2014).  A1c 5.7% 08/2015.  A1c 6.0% 08/2017. A1c 6.1% 09/2018.   Tendonitis, Achilles, right 2018   Podiatrist   Vallecular cyst 04/09/2015   ? cause of chronic cough?--Dr. Lazaro (ENT) removed this and her cough resolved.   Past Surgical History:  Procedure Laterality Date   BREAST CYST ASPIRATION     broken leg  6 months old   cataract surgery  4-12 and 5-12   COLONOSCOPY  2009; 01/04/18   2009 normal.  Rpt 01/04/18 normal (Dr. Kristie).  Recall 10 yrs.   Coronary calcium score     09/2023 LAD 23, 51st percentile   COSMETIC SURGERY  73 yrs old   nose   DEXA  2014; 2019   2014 T-score -1.7.   07/2017 T-score -2.0 femur neck. 09/2019 -2.1.  09/2021 -2.1.  10/2023 -2.3.   EYE SURGERY  2011   cateracts--each eye   INCISION AND DRAINAGE  08/11/2016   Epidermal inclusion cyst on back (done by MD at [facility] in [place]).   teeth pulled  73 yrs old   Vallecular cyst excision  2017   cough resolved after this was removed.   Social History[2] Family History  Problem Relation Age of Onset   Heart disease Father        passed after by pass surgery   Hypertension Father    Hyperlipidemia Father    Other Sister        auto immune issues   Gout Sister    Autoimmune disease Sister        fatigue, rashes   Osteopenia Sister    Breast cancer Sister    Cancer Sister    Learning disabilities Sister    Osteoporosis Mother    Arthritis Mother    Hearing loss Mother     Diabetes Paternal Grandfather    Allergies[3]  Medications: Patient's Medications  New Prescriptions   No medications on file  Previous Medications   ACYCLOVIR (ZOVIRAX) 400 MG TABLET    Take 100 mg by mouth 2 (two) times daily.   CALCIUM CARBONATE-VITAMIN D  (CALCIUM-VITAMIN D ) 500-200 MG-UNIT PER TABLET    Take 1 tablet by mouth 2 (two) times daily.   CONTINUOUS GLUCOSE SENSOR (FREESTYLE LIBRE 3 SENSOR) MISC    PLACE 1 SENSOR ON THE SKIN EVERY 14 DAYS. USE TO CHECK GLUCOSE CONTINUOUSLY   ESTRADIOL (ESTRACE) 0.1 MG/GM VAGINAL CREAM    Place 1 Applicatorful vaginally once a week. Pt uses every other week.   FERROUS SULFATE (SLOW FE PO)  Take by mouth.   HYDROCORTISONE  2.5 % CREAM       MULTIVITAMIN (THERAGRAN) TABLET    Take 1 tablet by mouth daily. One a day  Modified Medications   No medications on file  Discontinued Medications   No medications on file    Physical Exam: Vitals:   02/01/24 0842  BP: 128/84  Pulse: 79  Temp: 97.8 F (36.6 C)  TempSrc: Oral  SpO2: 100%  Weight: 127 lb (57.6 kg)   Body mass index is 20.81 kg/m. BP Readings from Last 3 Encounters:  02/01/24 128/84  09/08/23 105/69  04/23/23 131/77   Wt Readings from Last 3 Encounters:  02/01/24 127 lb (57.6 kg)  09/08/23 118 lb 6.4 oz (53.7 kg)  04/23/23 120 lb 3.2 oz (54.5 kg)    Physical Exam Constitutional:      Appearance: Normal appearance.  HENT:     Head: Normocephalic and atraumatic.  Cardiovascular:     Rate and Rhythm: Normal rate and regular rhythm.  Pulmonary:     Effort: Pulmonary effort is normal. No respiratory distress.     Breath sounds: Normal breath sounds. No wheezing.  Abdominal:     General: Bowel sounds are normal. There is no distension.     Tenderness: There is no abdominal tenderness. There is no guarding or rebound.     Comments:    Musculoskeletal:        General: No swelling or tenderness.  Neurological:     Mental Status: She is alert. Mental status is at  baseline.     Sensory: No sensory deficit.     Motor: No weakness.     Comments: Gait normal  Rombergs - Finger nose neg Heel to shin neg Strength and sensations intact     Labs reviewed: Basic Metabolic Panel: Recent Labs    04/23/23 1405 09/03/23 0917  NA 138 137  K 3.9 4.4  CL 100 101  CO2 29 29  GLUCOSE 118* 92  BUN 24* 21  CREATININE 0.66 0.77  CALCIUM 9.6 9.2   Liver Function Tests: Recent Labs    04/23/23 1405 09/03/23 0917  AST 20 21  ALT 18 20  ALKPHOS 76 67  BILITOT 0.4 0.7  PROT 6.4 6.4  ALBUMIN 4.2 4.2   No results for input(s): LIPASE, AMYLASE in the last 8760 hours. No results for input(s): AMMONIA in the last 8760 hours. CBC: Recent Labs    09/03/23 0917  WBC 4.3  NEUTROABS 2.4  HGB 12.3  HCT 37.0  MCV 85.1  PLT 194.0   Lipid Panel: Recent Labs    09/03/23 0917  CHOL 206*  HDL 73.20  LDLCALC 123*  TRIG 50.0  CHOLHDL 3   TSH: No results for input(s): TSH in the last 8760 hours. A1C: Lab Results  Component Value Date   HGBA1C 6.1 09/03/2023    Assessment & Plan Unsteady Neuro exam unremarkable Ortho stasis neg Will check labs Will refer to PT for balance training exercises Orders:   Basic Metabolic Panel (BMET)   CBC with Differential/Platelet   Ambulatory referral to Physical Therapy  Post-nasal drip Post pharyngeal wall erythema No exudates Will start claritin , aztelin Orders:   loratadine  (CLARITIN ) 10 MG tablet; Take 1 tablet (10 mg total) by mouth daily.   azelastine  (ASTELIN ) 0.1 % nasal spray; Place 1 spray into both nostrils 2 (two) times daily. Use in each nostril as directed    30 min Total time spent for obtaining history,  performing a medically appropriate examination and evaluation, reviewing the tests,ordering  tests,  documenting clinical information in the electronic or other health record, ,care coordination (not separately reported)      [1] No Known Allergies [2]  Social  History Tobacco Use   Smoking status: Never   Smokeless tobacco: Never  Vaping Use   Vaping status: Never Used  Substance Use Topics   Alcohol use: Yes    Comment: 1-2 beers a week or 1-2 glasses of wine a week   Drug use: No  [3] No Known Allergies

## 2024-02-02 ENCOUNTER — Ambulatory Visit: Payer: Self-pay | Admitting: Sports Medicine

## 2024-12-07 ENCOUNTER — Ambulatory Visit
